# Patient Record
Sex: Male | Born: 1995 | Race: White | Hispanic: No | Marital: Single | Smoking: Never smoker
Health system: Southern US, Community
[De-identification: ages and names within clinical notes are randomized; demographics above are authoritative.]

## PROBLEM LIST (undated history)

## (undated) DIAGNOSIS — M62830 Muscle spasm of back: Secondary | ICD-10-CM

## (undated) DIAGNOSIS — J45909 Unspecified asthma, uncomplicated: Secondary | ICD-10-CM

## (undated) DIAGNOSIS — M25511 Pain in right shoulder: Secondary | ICD-10-CM

## (undated) DIAGNOSIS — I10 Essential (primary) hypertension: Secondary | ICD-10-CM

## (undated) DIAGNOSIS — K429 Umbilical hernia without obstruction or gangrene: Secondary | ICD-10-CM

## (undated) DIAGNOSIS — L700 Acne vulgaris: Secondary | ICD-10-CM

## (undated) DIAGNOSIS — G8929 Other chronic pain: Secondary | ICD-10-CM

## (undated) HISTORY — PX: FOOT SURGERY: SHX648

## (undated) HISTORY — DX: Pain in right shoulder: M25.511

## (undated) HISTORY — DX: Umbilical hernia without obstruction or gangrene: K42.9

## (undated) HISTORY — DX: Muscle spasm of back: M62.830

## (undated) HISTORY — DX: Acne vulgaris: L70.0

## (undated) HISTORY — DX: Other chronic pain: G89.29

## (undated) HISTORY — DX: Unspecified asthma, uncomplicated: J45.909

## (undated) HISTORY — DX: Essential (primary) hypertension: I10

---
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
## 2013-03-20 ENCOUNTER — Ambulatory Visit (INDEPENDENT_AMBULATORY_CARE_PROVIDER_SITE_OTHER): Payer: BC Managed Care – PPO | Admitting: Internal Medicine

## 2013-03-20 ENCOUNTER — Encounter: Payer: Self-pay | Admitting: Internal Medicine

## 2013-03-20 VITALS — BP 124/80 | HR 84 | Temp 98.0°F | Wt 163.2 lb

## 2013-03-20 DIAGNOSIS — J069 Acute upper respiratory infection, unspecified: Secondary | ICD-10-CM

## 2013-03-20 MED ORDER — AZITHROMYCIN 250 MG PO TABS: ORAL_TABLET | ORAL | Status: DC

## 2013-03-20 NOTE — Progress Notes (Signed)
HPI  Pt presents to the clinic today with c/o cough, sore throat and ear fullness. This started about 2 weeks ago. The cough is non productive. He has tried Mucinex and Nyquil OTC.  He denies fever, chills or body aches. He has had sick contacts.  Review of Systems     History reviewed. No pertinent past medical history.  History reviewed. No pertinent family history.  History   Social History  . Marital Status: Single    Spouse Name: N/A    Number of Children: N/A  . Years of Education: N/A   Occupational History  . Not on file.   Social History Main Topics  . Smoking status: Never Smoker   . Smokeless tobacco: Never Used  . Alcohol Use: No  . Drug Use: No  . Sexual Activity: Not on file   Other Topics Concern  . Not on file   Social History Narrative  . No narrative on file    No Known Allergies   Constitutional: Positive headache, fatigue. Denies fever or abrupt weight changes.  HEENT:  Positive sore throat. Denies eye redness, eye pain, pressure behind the eyes, facial pain, nasal congestion, ear pain, ringing in the ears, wax buildup, runny nose or bloody nose. Respiratory: Positive cough. Denies difficulty breathing or shortness of breath.  Cardiovascular: Denies chest pain, chest tightness, palpitations or swelling in the hands or feet.   No other specific complaints in a complete review of systems (except as listed in HPI above).  Objective:   BP 124/80  Pulse 84  Temp(Src) 98 F (36.7 C) (Oral)  Wt 163 lb 4 oz (74.05 kg)  SpO2 98% Wt Readings from Last 3 Encounters:  03/20/13 163 lb 4 oz (74.05 kg) (65%*, Z = 0.37)  12/07/12 157 lb (71.215 kg) (57%*, Z = 0.18)  01/19/12 164 lb 12 oz (74.73 kg) (73%*, Z = 0.60)   * Growth percentiles are based on CDC 2-20 Years data.     General: Appears his stated age, well developed, well nourished in NAD. HEENT: Head: normal shape and size; Eyes: sclera white, no icterus, conjunctiva pink, PERRLA and EOMs  intact; Ears: Tm's gray and intact, normal light reflex; Nose: mucosa pink and moist, septum midline; Throat/Mouth: + PND. Teeth present, mucosa erythematous and moist, no exudate noted, no lesions or ulcerations noted.  Neck: Mild cervical lymphadenopathy. Neck supple, trachea midline. No massses, lumps or thyromegaly present.  Cardiovascular: Normal rate and rhythm. S1,S2 noted.  No murmur, rubs or gallops noted. No JVD or BLE edema. No carotid bruits noted. Pulmonary/Chest: Normal effort and positive vesicular breath sounds. No respiratory distress. No wheezes, rales or ronchi noted.      Assessment & Plan:   Upper Respiratory Infection  Get some rest and drink plenty of water Do salt water gargles for the sore throat Given duration of symptoms, will give eRx for Azithromax x 5 days   RTC as needed or if symptoms persist.  '

## 2013-03-20 NOTE — Progress Notes (Signed)
Pre-visit discussion using our clinic review tool. No additional management support is needed unless otherwise documented below in the visit note.  

## 2013-03-20 NOTE — Patient Instructions (Signed)

## 2013-05-24 ENCOUNTER — Encounter: Payer: Self-pay | Admitting: Family Medicine

## 2013-05-24 ENCOUNTER — Ambulatory Visit: Payer: BC Managed Care – PPO | Admitting: Internal Medicine

## 2013-05-24 ENCOUNTER — Ambulatory Visit (INDEPENDENT_AMBULATORY_CARE_PROVIDER_SITE_OTHER): Payer: BC Managed Care – PPO | Admitting: Family Medicine

## 2013-05-24 VITALS — BP 110/72 | HR 74 | Temp 98.3°F | Ht 69.0 in | Wt 164.2 lb

## 2013-05-24 DIAGNOSIS — G5620 Lesion of ulnar nerve, unspecified upper limb: Secondary | ICD-10-CM

## 2013-05-24 DIAGNOSIS — G5621 Lesion of ulnar nerve, right upper limb: Secondary | ICD-10-CM

## 2013-05-24 DIAGNOSIS — S29012A Strain of muscle and tendon of back wall of thorax, initial encounter: Secondary | ICD-10-CM | POA: Insufficient documentation

## 2013-05-24 DIAGNOSIS — S239XXA Sprain of unspecified parts of thorax, initial encounter: Secondary | ICD-10-CM

## 2013-05-24 MED ORDER — CYCLOBENZAPRINE HCL 10 MG PO TABS: 10.0000 mg | ORAL_TABLET | Freq: Every evening | ORAL | Status: DC | PRN

## 2013-05-24 MED ORDER — DICLOFENAC SODIUM 75 MG PO TBEC: 75.0000 mg | DELAYED_RELEASE_TABLET | Freq: Two times a day (BID) | ORAL | Status: DC

## 2013-05-24 NOTE — Progress Notes (Signed)
Pre visit review using our clinic review tool, if applicable. No additional management support is needed unless otherwise documented below in the visit note. 

## 2013-05-24 NOTE — Assessment & Plan Note (Signed)
Nsaids, heat, exercises and muscle relaxant. Pout of work until follow up.

## 2013-05-24 NOTE — Progress Notes (Signed)
   Subjective:    Patient ID: Charles Fischer, male    DOB: 07/29/1995, 18 y.o.   MRN: [id number]  Back Pain Pertinent negatives include no chest pain or fever.    18 year old male with previous low back pain  and  pt of Dr. Elmer SowAron's presents with new onset of sudden back pain this AM, occurred after bending over.  He has been lifting a lot of heavy stuff at work lately.  Pain in middle of back but over to the left.  Moving arms, legs and head causes pain in mid back. No radiation of pain.  He has noted numbness in right, pinky finger. No numbness elsewhere. No weakness. No incontinence, no fever.  Felt lightheaded, cold and sweating after the pain started, now resolved.    Review of Systems  Constitutional: Positive for chills. Negative for fever and fatigue.  HENT: Negative for ear pain.   Eyes: Negative for pain.  Respiratory: Negative for cough and shortness of breath.   Cardiovascular: Negative for chest pain.  Musculoskeletal: Positive for back pain.       Objective:   Physical Exam  Constitutional: Vital signs are normal. He appears well-developed and well-nourished.  HENT:  Head: Normocephalic.  Right Ear: Hearing normal.  Left Ear: Hearing normal.  Nose: Nose normal.  Mouth/Throat: Oropharynx is clear and moist and mucous membranes are normal.  Neck: Trachea normal. Carotid bruit is not present. No mass and no thyromegaly present.  Cardiovascular: Normal rate, regular rhythm and normal pulses.  Exam reveals no gallop, no distant heart sounds and no friction rub.   No murmur heard. No peripheral edema  Pulmonary/Chest: Effort normal and breath sounds normal. No respiratory distress.  Musculoskeletal:       Thoracic back: He exhibits tenderness. He exhibits no bony tenderness.       Lumbar back: He exhibits no bony tenderness.       Back:  Positive ulnar compression on right elbow  Neurological: He has normal strength. No cranial nerve deficit or sensory  deficit. Gait normal.  Skin: Skin is warm, dry and intact. No rash noted.  Psychiatric: He has a normal mood and affect. His speech is normal and behavior is normal. Thought content normal.          Assessment & Plan:

## 2013-05-24 NOTE — Assessment & Plan Note (Signed)
Avoid compression at sleep and work.

## 2013-05-24 NOTE — Patient Instructions (Signed)
Heat, gentle stretching.  Stat antiinflammatory and muscle relaxant.  Stay out of work until next appt.  Follow up next Monday with PCP or Dr. Leonard SchwartzB.

## 2013-05-28 ENCOUNTER — Ambulatory Visit (INDEPENDENT_AMBULATORY_CARE_PROVIDER_SITE_OTHER): Payer: BC Managed Care – PPO | Admitting: Family Medicine

## 2013-05-28 ENCOUNTER — Encounter: Payer: Self-pay | Admitting: Family Medicine

## 2013-05-28 VITALS — BP 120/72 | HR 94 | Temp 98.3°F | Ht 70.0 in | Wt 161.2 lb

## 2013-05-28 DIAGNOSIS — S239XXA Sprain of unspecified parts of thorax, initial encounter: Secondary | ICD-10-CM

## 2013-05-28 DIAGNOSIS — G5621 Lesion of ulnar nerve, right upper limb: Secondary | ICD-10-CM

## 2013-05-28 DIAGNOSIS — G5620 Lesion of ulnar nerve, unspecified upper limb: Secondary | ICD-10-CM

## 2013-05-28 DIAGNOSIS — S29012A Strain of muscle and tendon of back wall of thorax, initial encounter: Secondary | ICD-10-CM

## 2013-05-28 MED ORDER — CYCLOBENZAPRINE HCL 10 MG PO TABS
10.0000 mg | ORAL_TABLET | Freq: Every evening | ORAL | Status: DC | PRN
Start: 2013-05-28 — End: 2016-03-05

## 2013-05-28 NOTE — Progress Notes (Signed)
Pre visit review using our clinic review tool, if applicable. No additional management support is needed unless otherwise documented below in the visit note. 

## 2013-05-28 NOTE — Assessment & Plan Note (Signed)
Resolved

## 2013-05-28 NOTE — Progress Notes (Signed)
Subjective:    Patient ID: Billey ChangDerrick M Prine, male    DOB: 12/26/1995, 18 y.o.   MRN: [id number]  Back Pain Pertinent negatives include no chest pain or fever.    18 year old male here for follow up.  Saw Dr. Ermalene SearingBedsole on 3/26 for new onset of sudden back pain after bending over. Note reviewed.   Pain in middle of back but over to the left.  Moving arms, legs and head causes pain in mid back. No radiation of pain.  He had and still does not have any red flag symptoms like weakness, incontinence, fever.  Dr. B felt he had an ulnar neuropathy and left upper back strain.  Advised NSAIDs, heat, exercises, muscle relaxant and wrote him out of work until he could follow up today.  Feels much better but still can feel it at the end of the day.  Takes flexeril at night only.  Patient Active Problem List   Diagnosis Date Noted  . Muscle strain of left upper back 05/24/2013  . Ulnar neuropathy at elbow of right upper extremity 05/24/2013  . Hypertriglyceridemia 10/19/2011  . Cystic acne 01/14/2011   No past medical history on file. Past Surgical History  Procedure Laterality Date  . Foot surgery  2009-2010   History  Substance Use Topics  . Smoking status: Never Smoker   . Smokeless tobacco: Never Used  . Alcohol Use: No   No family history on file. No Known Allergies Current Outpatient Prescriptions on File Prior to Visit  Medication Sig Dispense Refill  . diclofenac (VOLTAREN) 75 MG EC tablet Take 1 tablet (75 mg total) by mouth 2 (two) times daily.  30 tablet  0  . MINOCYCLINE HCL PO Take 2 capsules by mouth daily. For acne       No current facility-administered medications on file prior to visit.   The PMH, PSH, Social History, Family History, Medications, and allergies have been reviewed [facility], and have been updated if relevant.     Review of Systems  Constitutional: Negative for fever and fatigue.  HENT: Negative for ear pain.   Eyes: Negative for pain.    Respiratory: Negative for cough and shortness of breath.   Cardiovascular: Negative for chest pain.  Musculoskeletal: Positive for back pain.       Objective:   Physical Exam  Constitutional: Vital signs are normal. He appears well-developed and well-nourished.  HENT:  Head: Normocephalic.  Right Ear: Hearing normal.  Left Ear: Hearing normal.  Nose: Nose normal.  Mouth/Throat: Oropharynx is clear and moist and mucous membranes are normal.  Neck: Trachea normal. Carotid bruit is not present. No mass and no thyromegaly present.  Cardiovascular: Normal rate, regular rhythm and normal pulses.  Exam reveals no gallop, no distant heart sounds and no friction rub.   No murmur heard. No peripheral edema  Pulmonary/Chest: Effort normal and breath sounds normal. No respiratory distress.  Musculoskeletal:       Thoracic back: He exhibits tenderness. He exhibits no bony tenderness.       Lumbar back: He exhibits no bony tenderness.       Back:  Neurological: He has normal strength. No cranial nerve deficit or sensory deficit. Gait normal.  Skin: Skin is warm, dry and intact. No rash noted.  Psychiatric: He has a normal mood and affect. His speech is normal and behavior is normal. Thought content normal.          Assessment & Plan:

## 2013-05-28 NOTE — Assessment & Plan Note (Signed)
Improving.  Continue supportive care. Flexeril refilled. See AVS. Call or return to clinic prn if these symptoms worsen or fail to improve as anticipated. The patient indicates understanding of these issues and agrees with the plan.

## 2013-05-28 NOTE — Patient Instructions (Signed)
Good to see you. Continue flexeril at bedtime as needed. Try the hot water bottle.  Bend at the knees!

## 2013-06-05 ENCOUNTER — Other Ambulatory Visit: Payer: Self-pay | Admitting: Family Medicine

## 2013-06-05 NOTE — Telephone Encounter (Signed)
Last office visit 05/28/2013.  Ok to refill? 

## 2013-12-19 DIAGNOSIS — J309 Allergic rhinitis, unspecified: Secondary | ICD-10-CM | POA: Insufficient documentation

## 2014-03-01 HISTORY — PX: LASIK: SHX215

## 2014-08-01 ENCOUNTER — Other Ambulatory Visit: Payer: Self-pay | Admitting: Family Medicine

## 2014-10-03 ENCOUNTER — Ambulatory Visit (INDEPENDENT_AMBULATORY_CARE_PROVIDER_SITE_OTHER): Payer: BLUE CROSS/BLUE SHIELD | Admitting: Family Medicine

## 2014-10-03 ENCOUNTER — Encounter: Payer: Self-pay | Admitting: Family Medicine

## 2014-10-03 VITALS — BP 118/64 | HR 82 | Temp 98.2°F | Resp 14 | Ht 73.0 in | Wt 165.6 lb

## 2014-10-03 DIAGNOSIS — L700 Acne vulgaris: Secondary | ICD-10-CM | POA: Insufficient documentation

## 2014-10-03 DIAGNOSIS — R197 Diarrhea, unspecified: Secondary | ICD-10-CM | POA: Insufficient documentation

## 2014-10-03 DIAGNOSIS — J45909 Unspecified asthma, uncomplicated: Secondary | ICD-10-CM | POA: Insufficient documentation

## 2014-10-03 DIAGNOSIS — M25519 Pain in unspecified shoulder: Secondary | ICD-10-CM | POA: Insufficient documentation

## 2014-10-03 DIAGNOSIS — M62830 Muscle spasm of back: Secondary | ICD-10-CM | POA: Insufficient documentation

## 2014-10-03 NOTE — Patient Instructions (Signed)
Chronic Diarrhea  Diarrhea is frequent loose and watery bowel movements. It can cause you to feel weak and dehydrated. Dehydration can cause you to become tired and thirsty and to have a dry mouth, decreased urination, and dark yellow urine. Diarrhea is a sign of another problem, most often an infection that will not last long. In most cases, diarrhea lasts 2-3 days. Diarrhea that lasts longer than 4 weeks is called long-lasting (chronic) diarrhea. It is important to treat your diarrhea as directed by your health care provider to lessen or prevent future episodes of diarrhea.   CAUSES   There are many causes of chronic diarrhea. The following are some possible causes:   · Gastrointestinal infections caused by viruses, bacteria, or parasites.    · Food poisoning or food allergies.    · Certain medicines, such as antibiotics, chemotherapy, and laxatives.    · Artificial sweeteners and fructose.    · Digestive disorders, such as celiac disease and inflammatory bowel diseases.    · Irritable bowel syndrome.  · Some disorders of the pancreas.  · Disorders of the thyroid.  · Reduced blood flow to the intestines.  · Cancer.  Sometimes the cause of chronic diarrhea is unknown.  RISK FACTORS  · Having a severely weakened immune system, such as from HIV or AIDS.    · Taking certain types of cancer-fighting drugs (such as with chemotherapy) or other medicines.    · Having had a recent organ transplant.    · Having a portion of the stomach or small bowel removed.    · Traveling to countries where food and water supplies are often contaminated.    SYMPTOMS   In addition to frequent, loose stools, diarrhea may cause:   · Cramping.    · Abdominal pain.    · Nausea.    · Fever.  · Fatigue.  · Urgent need to use the bathroom.  · Loss of bowel control.  DIAGNOSIS   Your health care provider must take a careful history and perform a physical exam. Tests given are based on your symptoms and history. Tests may include:   · Blood or  stool tests. Three or more stool samples may be examined. Stool cultures may be used to test for bacteria or parasites.    · X-rays.    · A procedure in which a thin tube is inserted into the mouth or rectum (endoscopy). This allows the health care provider to look inside the intestine.    TREATMENT   · Treatment is aimed at correcting the cause of the diarrhea when possible.  · Diarrhea caused by an infection can often be treated with antibiotic medicines.  · Diarrhea not caused by an infection may require you to take long-term medicine or have surgery. Specific treatment should be discussed with your health care provider.  · If the cause cannot be determined, treatment aims to relieve symptoms and prevent dehydration. Serious health problems can occur if you do not maintain proper fluid levels. Treatment may include:  ¨ Taking an oral rehydration solution (ORS).  ¨ Not drinking beverages that contain caffeine (such as tea, coffee, and soft drinks).  ¨ Not drinking alcohol.  ¨ Maintaining well-balanced nutrition to help you recover faster.  HOME CARE INSTRUCTIONS   · Drink enough fluids to keep urine clear or pale yellow. Drink 1 cup (8 oz) of fluid for each diarrhea episode. Avoid fluids that contain simple sugars, fruit juices, whole milk products, and sodas. Hydrate with an ORS. You may purchase the ORS or prepare it at home by mixing the   following ingredients together:  ¨  - tsp (1.7-3  mL) table salt.  ¨ ¾ tsp (3 ¾ mL) baking soda.  ¨  tsp (1.7 mL) salt substitute containing potassium chloride.  ¨ 1 tbsp (20 mL) sugar.  ¨ 4.2 c (1 L) of water.    · Certain foods and beverages may increase the speed at which food moves through the gastrointestinal (GI) tract. These foods and beverages should be avoided. They include:  ¨ Caffeinated and alcoholic beverages.  ¨ High-fiber foods, such as raw fruits and vegetables, nuts, seeds, and whole grain breads and cereals.  ¨ Foods and beverages sweetened with sugar  alcohols, such as xylitol, sorbitol, and mannitol.    · Some foods may be well tolerated and may help thicken stool. These include:  ¨ Starchy foods, such as rice, toast, pasta, low-sugar cereal, oatmeal, grits, baked potatoes, crackers, and bagels.  ¨ Bananas.  ¨ Applesauce.  · Add probiotic-rich foods to help increase healthy bacteria in the GI tract. These include yogurt and fermented milk products.  · Wash your hands well after each diarrhea episode.  · Only take over-the-counter or prescription medicines as directed by your health care provider.  · Take a warm bath to relieve any burning or pain from frequent diarrhea episodes.  SEEK MEDICAL CARE IF:   · You are not urinating as often.  · Your urine is a dark color.  · You become very tired or dizzy.  · You have severe pain in the abdomen or rectum.  · Your have blood or pus in your stools.  · Your stools look black and tarry.  SEEK IMMEDIATE MEDICAL CARE IF:   · You are unable to keep fluids down.  · You have persistent vomiting.  · You have blood in your stool.  · Your stools are black and tarry.  · You do not urinate in 6-8 hours, or there is only a small amount of very dark urine.  · You have abdominal pain that increases or localizes.  · You have weakness, dizziness, confusion, or lightheadedness.  · You have a severe headache.  · Your diarrhea gets worse or does not get better.  · You have a fever or persistent symptoms for more than 2-3 days.  · You have a fever and your symptoms suddenly get worse.  MAKE SURE YOU:   · Understand these instructions.  · Will watch your condition.  · Will get help right away if you are not doing well or get worse.  Document Released: 05/08/2003 Document Revised: 02/20/2013 Document Reviewed: 08/10/2012  Exit[employer]® Patient Information ©2015 [employer]. This information is not intended to replace advice given to you by your health care provider. Make sure you discuss any questions you have with your health care  provider.

## 2014-10-03 NOTE — Progress Notes (Signed)
Name: Charles Fischer   MRN: [id number]    DOB: [birth year]   Date:10/03/2014       Progress Note  Subjective  Chief Complaint  Chief Complaint  Patient presents with  . Diarrhea    onset since feb. has changed diet.  Everyday occurence, Pt deniesany abdominal pain    HPI  Diarrhea: Patient complains of diarrhea. Symptoms have been present for approximately 5 months. The symptoms are unchanged. Stool frequency is approximately 2 per day. Patient estimates stool volume to be patient is unable to estimate stool volume. Diarrhea does not occur at night. The patient has noted no bleeding associated with bowel movements. The also patient reports the following symptoms: none. The patient denies the following symptoms: abdominal cramping relieved by defecation, abdominal distension, bloating, cramping unrelieved by defecation, diarrhea alternating with constipation, fecal incontinence, fever, joint aches, localized abdominal pain (location: not applicable), melena, mucus with stools, night sweats, pus with stools, red blood with stools and weight loss. The patient currently denies significant abdominal pain or discomfort.   Relationship to food: the patient denies any relationship to food, but he has changed his diet to a high protein low carb diet starting about March 2016. Relationship to medications: the patient denies any relationship to medications.  He has not been using his acne medication minocycline since before all this started. Other risk factors: none. Therapy tried so far: none. Work up so far: none.    Patient Active Problem List   Diagnosis Date Noted  . Back muscle spasm 10/03/2014  . Pain in shoulder 10/03/2014  . Acne cystica 10/03/2014  . Mild persistent asthma with acute exacerbation 10/03/2014  . Allergic rhinitis 12/19/2013  . Muscle strain of left upper back 05/24/2013  . Ulnar neuropathy at elbow of right upper extremity 05/24/2013  . Hypertriglyceridemia 10/19/2011  .  Cystic acne 01/14/2011    History  Substance Use Topics  . Smoking status: Never Smoker   . Smokeless tobacco: Never Used  . Alcohol Use: No     Current outpatient prescriptions:  .  cyclobenzaprine (FLEXERIL) 10 MG tablet, Take 1 tablet (10 mg total) by mouth at bedtime as needed for muscle spasms., Disp: 15 tablet, Rfl: 0 .  MINOCYCLINE HCL PO, Take 2 capsules by mouth daily. For acne, Disp: , Rfl:   Past Surgical History  Procedure Laterality Date  . Foot surgery  2009-2010  . Achilles tendon lengthening Bilateral 2009    Family History  Problem Relation Age of Onset  . Alcohol abuse Father   . Hyperlipidemia Father   . Cancer Maternal Grandmother     breast    No Known Allergies   Review of Systems  CONSTITUTIONAL: No significant weight changes, fever, chills, weakness or fatigue.  HEENT:  - Eyes: No visual changes.  - Ears: No auditory changes. No pain.  - Nose: No sneezing, congestion, runny nose. - Throat: No sore throat. No changes in swallowing. SKIN: No rash or itching.  CARDIOVASCULAR: No chest pain, chest pressure or chest discomfort. No palpitations or edema.  RESPIRATORY: No shortness of breath, cough or sputum.  GASTROINTESTINAL: No anorexia, nausea, vomiting. Yes changes in bowel habits. No abdominal pain or blood.  GENITOURINARY: No dysuria. No frequency. No discharge. NEUROLOGICAL: No headache, dizziness, syncope, paralysis, ataxia, numbness or tingling in the extremities. No memory changes. No change in bowel or bladder control.  MUSCULOSKELETAL: No joint pain. No muscle pain. HEMATOLOGIC: No anemia, bleeding or bruising.  LYMPHATICS: No enlarged lymph  nodes.  PSYCHIATRIC: No change in mood. No change in sleep pattern.  ENDOCRINOLOGIC: No reports of sweating, cold or heat intolerance. No polyuria or polydipsia.     Objective  BP 118/64 mmHg  Pulse 82  Temp(Src) 98.2 F (36.8 C) (Oral)  Resp 14  Ht 6\' 1"  (1.854 m)  Wt 165 lb 9.6 oz  (75.116 kg)  BMI 21.85 kg/m2  SpO2 98% Body mass index is 21.85 kg/(m^2).  Physical Exam  Constitutional: Patient appears well-developed and well-nourished. In no distress.  HEENT:  - Head: Normocephalic and atraumatic.  - Ears: Bilateral TMs gray, no erythema or effusion - Nose: Nasal mucosa moist - Mouth/Throat: Oropharynx is clear and moist. No tonsillar hypertrophy or erythema. No post nasal drainage.  - Eyes: Conjunctivae clear, EOM movements normal. PERRLA. No scleral icterus.  Neck: Normal range of motion. Neck supple. No JVD present. No thyromegaly present.  Cardiovascular: Normal rate, regular rhythm and normal heart sounds.  No murmur heard.  Pulmonary/Chest: Effort normal and breath sounds normal. No respiratory distress. Abdomen: Soft, flat, non tender, normal bowel sounds, no HSM. Musculoskeletal: Normal range of motion bilateral UE and LE, no joint effusions. Peripheral vascular: Bilateral LE no edema. Neurological: CN II-XII grossly intact with no focal deficits. Alert and oriented to person, place, and time. Coordination, balance, strength, speech and gait are normal.  Skin: Skin is warm and dry. No rash noted. No erythema.  Psychiatric: Patient has a normal mood and affect. Behavior is normal in office today. Judgment and thought content normal in office today.   Assessment & Plan  1. Diarrhea I am concerned for C.Diff infection given his previous history of Minocyclin use for acne even though he has not been using the medication prior to diarrhea onset. [facility] stool studies ordered. Other etiologies considered IBS or Celiac however he has no abdominal pain. He does not consume dairy much so I do not feel this is related to lactose intolerance.

## 2014-10-04 ENCOUNTER — Telehealth: Payer: Self-pay | Admitting: Family Medicine

## 2014-10-04 NOTE — Telephone Encounter (Signed)
[name]) requesting a return call. Patient was sent home on yesterday with a stool sample kit. They need clarification on what to do with the sodium chloride. Also have a question concerning the paperwork that came with it. [phone]

## 2014-10-04 NOTE — Telephone Encounter (Signed)
Charles Fischer can you please clarify the instructions on collecting the stool sample for his [facility] kit? Thank you.

## 2014-10-14 ENCOUNTER — Other Ambulatory Visit: Payer: Self-pay | Admitting: Family Medicine

## 2014-10-14 ENCOUNTER — Telehealth: Payer: Self-pay | Admitting: Family Medicine

## 2014-10-14 DIAGNOSIS — B839 Helminthiasis, unspecified: Secondary | ICD-10-CM | POA: Insufficient documentation

## 2014-10-14 MED ORDER — MEBENDAZOLE 100 MG PO CHEW: 100.0000 mg | CHEWABLE_TABLET | Freq: Two times a day (BID) | ORAL | Status: DC

## 2014-10-14 NOTE — Telephone Encounter (Signed)
Mom called stating that her son's sx persists, but she now knows why. She stated that he has worms and they have seen them. His rectum itches very badly and wanted to know what they should do. She stated that they all share a toilet and wanted to know if they should be concerned. I told her that I will consult with Dr. Sherley Bounds then will let her know.  After consulting with Dr. Sherley Bounds, mom was informed that a medication will be sent in fro her son, but if his sx continues then he will need to proceed with the stool studies or we can refer him to a GI specialist. She was also told that if any of them developed any sx then they will have to be seen by their pcp so that it could be documented and then a medication could be given. She agreed.

## 2014-10-14 NOTE — Telephone Encounter (Signed)
Steward Drone (mom) is requesting a return call. Not able to tell me why because it is personal (H) [phone] (C) [phone]

## 2014-10-14 NOTE — Telephone Encounter (Signed)
I have sent in Mebendazole  one twice a day for 3 days. May need to be repeated if symptoms ongoing in 1 week.

## 2014-10-24 ENCOUNTER — Telehealth: Payer: Self-pay | Admitting: Family Medicine

## 2014-10-24 NOTE — Telephone Encounter (Signed)
Contacted this patient's mom to relay the message below. She was encouraged to proceed with the study and then we could see what else needs to be done afterwards. She agreed and said ok.

## 2014-10-24 NOTE — Telephone Encounter (Signed)
Sorry to hear he is still having issues. He should still have the [facility] stool study kit with him that he needs to collect his stool specimen as instructed and bring back his sample to [place] to send out. If he lost the instructions have him stop by for nursing visit to teach him what he needs to do.

## 2014-10-24 NOTE — Telephone Encounter (Signed)
SAID THAT THE PATIENT MEDICATION IS NOT WORKING AND WHERE DO THEY GO FROM HERE.

## 2014-11-11 ENCOUNTER — Telehealth: Payer: Self-pay

## 2014-11-11 NOTE — Telephone Encounter (Signed)
Called to get results of son's drg testing. She was told by Marylene Land at [facility] that she was sending over results to Dr. Sherley Bounds last Wednesday. I told her that we have yet to received anything but if she has not heard form [place] by this Wednesday to give [place] a call back. She said ok.

## 2014-11-11 NOTE — Telephone Encounter (Signed)
I have not had anything return to me via EMR or hard copy regarding his stool studies. Please contact [facility] representative and inquire as to the results.

## 2014-11-12 NOTE — Telephone Encounter (Signed)
Spoke to Charles Fischer with customer service at [employer] and she stated that his labs were complete on the 8th and was released to the portal. I gave her our fax number and she said she will print it and then fax it to our office.

## 2014-11-13 ENCOUNTER — Telehealth: Payer: Self-pay | Admitting: Family Medicine

## 2014-11-13 NOTE — Telephone Encounter (Signed)
NEEDS RESULTS BUT HAS NEVER GOT A CALL BACK

## 2014-11-14 NOTE — Telephone Encounter (Signed)
Please return Steward Drone (mom) call just to inform her as to what is going on. States she has been calling every since last week for her son test results and have not heard anything. He has not gotten any better. Thank you

## 2014-11-14 NOTE — Telephone Encounter (Signed)
Left a message for the mom stating that we were still waiting to get the faxed results from [facility] and that we have even tried to access the portal but cannot get in.

## 2014-11-15 ENCOUNTER — Other Ambulatory Visit: Payer: Self-pay | Admitting: Family Medicine

## 2014-11-15 ENCOUNTER — Telehealth: Payer: Self-pay | Admitting: Family Medicine

## 2014-11-15 DIAGNOSIS — R197 Diarrhea, unspecified: Secondary | ICD-10-CM

## 2014-11-15 NOTE — Telephone Encounter (Signed)
Contacted this patient's mom to review the message from Dr. Sherley Bounds regarding his DRG results and to see if she wanted to proceed with a GI referral. Mom agreed and stated that her son starting working out in January and was consuming protein shakes as well as creatine everyday which is when his sx started. Mom was encouraged to tell him to stop and to see if that helped reduce or eliminate his sx. Meanwhile the referral will be placed and he is to keep a journal of all his meals/ sx to share with the GI specialist. She agreed and said thanks.

## 2014-11-15 NOTE — Telephone Encounter (Signed)
Please let Shana know that his stool studies have returned negative for C.diff, salmonella, shigella, E.coli, parasites, fungi. He has been exposed to H.pylori in the past but that shouldn't cause diarrhea for months. That typically causes epigastric discomfort and reflux.  At this point if his symptoms are ongoing I want him to see a GI specialist. Can I place the referral?

## 2014-12-10 ENCOUNTER — Ambulatory Visit (INDEPENDENT_AMBULATORY_CARE_PROVIDER_SITE_OTHER): Payer: BLUE CROSS/BLUE SHIELD | Admitting: Family Medicine

## 2014-12-10 ENCOUNTER — Encounter: Payer: Self-pay | Admitting: Family Medicine

## 2014-12-10 VITALS — BP 124/80 | HR 87 | Temp 99.0°F | Resp 16 | Ht 73.0 in | Wt 177.4 lb

## 2014-12-10 DIAGNOSIS — R197 Diarrhea, unspecified: Secondary | ICD-10-CM | POA: Diagnosis not present

## 2014-12-10 DIAGNOSIS — R1033 Periumbilical pain: Secondary | ICD-10-CM | POA: Diagnosis not present

## 2014-12-10 NOTE — Progress Notes (Signed)
Name: Charles Fischer   MRN: [id number]    DOB: June 16, 1995   Date:12/10/2014       Progress Note  Subjective  Chief Complaint  Chief Complaint  Patient presents with  . Umbilical Hernia    pt has had pain in area for 1 week    HPI  Charles Fischer is a 19 year old male who reports abdominal pain and discomfort in the umbilical area for 1 week now. He had been exercising with weights a little more aggressively and was not sure if he tore something or started developing a hernia. He does not note a bulge with coughing or sitting or standing. No nausea, vomiting.  If he recall he has also been having some changes to his BMs for several months now, Onset Feb 2016. Which he reports today has settled down but not resolved.  He was referred to [facility] for diarrhea for several months 11/15/14 with negative stool studies other than previous exposure to H.Pylori and normal blood work. He thought he had worms and was treated with Mebendazole but his stool study was negative. Patient instructed to keep food journal.  Past Medical History  Diagnosis Date  . Mild persistent asthma with acute exacerbation   . Cystic acne vulgaris   . Chronic right shoulder pain   . Back muscle spasm     Patient Active Problem List   Diagnosis Date Noted  . Worm infection 10/14/2014  . Back muscle spasm 10/03/2014  . Pain in shoulder 10/03/2014  . Acne cystica 10/03/2014  . Mild persistent asthma with acute exacerbation 10/03/2014  . Diarrhea 10/03/2014  . Allergic rhinitis 12/19/2013  . Muscle strain of left upper back 05/24/2013  . Ulnar neuropathy at elbow of right upper extremity 05/24/2013  . Hypertriglyceridemia 10/19/2011  . Cystic acne 01/14/2011    Social History  Substance Use Topics  . Smoking status: Never Smoker   . Smokeless tobacco: Never Used  . Alcohol Use: No     Current outpatient prescriptions:  .  cyclobenzaprine (FLEXERIL) 10 MG tablet, Take 1 tablet (10 mg total) by  mouth at bedtime as needed for muscle spasms., Disp: 15 tablet, Rfl: 0  No Known Allergies  Review of Systems  Positive for umbilical discomfort with ongoing loose bowel movements as mentioned in HPI, otherwise all systems reviewed and are negative.  Objective  BP 124/80 mmHg  Pulse 87  Temp(Src) 99 F (37.2 C)  Resp 16  Ht  (1.854 m)  Wt 177 lb 6 oz (80.457 kg)  BMI 23.41 kg/m2  SpO2 98%  Body mass index is 23.41 kg/(m^2).   Physical Exam  Constitutional: Patient appears well-developed and well-nourished. In no distress.  Cardiovascular: Normal rate, regular rhythm and normal heart sounds.  No murmur heard.  Pulmonary/Chest: Effort normal and breath sounds normal. No respiratory distress. Abdomen: Soft, non tender, non distended, no hernias identified with valsalva sitting or laying, normal bowel sounds in all four quadrants. No HSM. Psychiatric: Patient has a normal mood and affect. Behavior is normal in office today. Judgment and thought content normal in office today.    Assessment & Plan  1. Umbilical pain Clinical exam did not show significant hernia. I recommended proceeding with a CT of abdomen however patient declined until he found out of his insurance would cover it.   2. Diarrhea, unspecified type GI referral in place. Will make sure he is aware of his appointment.

## 2014-12-26 ENCOUNTER — Encounter: Payer: Self-pay | Admitting: Family Medicine

## 2015-02-28 ENCOUNTER — Ambulatory Visit (INDEPENDENT_AMBULATORY_CARE_PROVIDER_SITE_OTHER): Payer: BLUE CROSS/BLUE SHIELD | Admitting: Family Medicine

## 2015-02-28 ENCOUNTER — Encounter: Payer: Self-pay | Admitting: Family Medicine

## 2015-02-28 VITALS — BP 120/69 | HR 105 | Temp 98.9°F | Resp 17 | Ht 73.0 in | Wt 180.3 lb

## 2015-02-28 DIAGNOSIS — J45909 Unspecified asthma, uncomplicated: Secondary | ICD-10-CM | POA: Insufficient documentation

## 2015-02-28 DIAGNOSIS — Z2821 Immunization not carried out because of patient refusal: Secondary | ICD-10-CM

## 2015-02-28 DIAGNOSIS — J4531 Mild persistent asthma with (acute) exacerbation: Secondary | ICD-10-CM | POA: Diagnosis not present

## 2015-02-28 DIAGNOSIS — Z0000 Encounter for general adult medical examination without abnormal findings: Secondary | ICD-10-CM | POA: Diagnosis not present

## 2015-02-28 MED ORDER — PREDNISONE 10 MG (21) PO TBPK: ORAL_TABLET | ORAL | Status: DC

## 2015-02-28 MED ORDER — AMOXICILLIN-POT CLAVULANATE 875-125 MG PO TABS
1.0000 | ORAL_TABLET | Freq: Two times a day (BID) | ORAL | Status: DC
Start: 2015-02-28 — End: 2016-03-05

## 2015-02-28 NOTE — Progress Notes (Signed)
Name: Charles Fischer   MRN: [id number]    DOB: 1995-12-13   Date:02/28/2015       Progress Note  Subjective  Chief Complaint  Chief Complaint  Patient presents with  . Annual Exam    CPE    HPI  Patient is here today for a Complete Male Physical Exam:  The patient has acute concerns consisting of cough, slight wheezing, productive cough, nasal congestion, ear pressure. Overall feels health needs are stable. Still has loose BMs on/off, abdominal hernia resolved, occasional back problems managed with muscle relaxer exercise and chiropractor. Diet is well balanced. In general does exercise regularly. Sees dentist regularly and addresses vision concerns with ophthalmologist if applicable. In regards to sexual activity the patient is currently sexually active. Currently is not concerned about exposure to any STDs.   Declines flu shot, tetanus booster, STD testing.   Past Medical History  Diagnosis Date  . Mild persistent asthma with acute exacerbation   . Cystic acne vulgaris   . Chronic right shoulder pain   . Back muscle spasm     Past Surgical History  Procedure Laterality Date  . Foot surgery  2009-2010  . Achilles tendon lengthening Bilateral 2009    Family History  Problem Relation Age of Onset  . Alcohol abuse Father   . Hyperlipidemia Father   . Cancer Maternal Grandmother     breast    Social History   Social History  . Marital Status: Single    Spouse Name: N/A  . Number of Children: N/A  . Years of Education: N/A   Occupational History  . Not on file.   Social History Main Topics  . Smoking status: Never Smoker   . Smokeless tobacco: Never Used  . Alcohol Use: No  . Drug Use: No  . Sexual Activity: Yes   Other Topics Concern  . Not on file   Social History Narrative     Current outpatient prescriptions:  .  cyclobenzaprine (FLEXERIL) 10 MG tablet, Take 1 tablet (10 mg total) by mouth at bedtime as needed for muscle spasms., Disp: 15  tablet, Rfl: 0 .  DUREZOL 0.05 % EMUL, USE 1 DROP IN BOTH EYES 3 TIMES A DAY X 2 DAYS PRIOR TO SURGERY & 7 DAYS AFTER SURGERY OR TIL EMPTY, Disp: , Rfl: 2 .  minocycline (MINOCIN,DYNACIN) 50 MG capsule, Take 2 capsules by mouth daily., Disp: , Rfl: 1 .  RESTASIS 0.05 % ophthalmic emulsion, USE 1 DROP TWICE A DAY IN BOTH EYES, Disp: , Rfl: 2 .  ZYMAXID 0.5 % SOLN, PLACE 1 DROP IN BOTH EYES 3 TIMES DAILY X 2 DAYS PRIOR TO SURGERY AND FOR 7 DAYS AFTER, Disp: , Rfl: 2  No Known Allergies  ROS  CONSTITUTIONAL: No significant weight changes, fever, chills, weakness or fatigue.  HEENT:  - Eyes: No visual changes.  - Ears: No auditory changes. No pain.  - Nose: Yes congestion, stuffy nose. - Throat: No sore throat. No changes in swallowing. SKIN: No rash or itching.  CARDIOVASCULAR: No chest pain, chest pressure or chest discomfort. No palpitations or edema.  RESPIRATORY: Yes shortness of breath, cough or sputum.  GASTROINTESTINAL: No anorexia, nausea, vomiting. No changes in bowel habits. No abdominal pain or blood.  GENITOURINARY: No dysuria. No frequency. No discharge.  NEUROLOGICAL: No headache, dizziness, syncope, paralysis, ataxia, numbness or tingling in the extremities. No memory changes. No change in bowel or bladder control.  MUSCULOSKELETAL: No joint pain. No muscle pain.  HEMATOLOGIC: No anemia, bleeding or bruising.  LYMPHATICS: No enlarged lymph nodes.  PSYCHIATRIC: No change in mood. No change in sleep pattern.  ENDOCRINOLOGIC: No reports of sweating, cold or heat intolerance. No polyuria or polydipsia.   Objective  Filed Vitals:   02/28/15 1430  BP: 120/69  Pulse: 105  Temp: 98.9 F (37.2 C)  TempSrc: Oral  Resp: 17  Height: 6\' 1"  (1.854 m)  Weight: 180 lb 4.8 oz (81.784 kg)  SpO2: 99%   Body mass index is 23.79 kg/(m^2).  [facility] 02/28/2015 10/03/2014  Decreased Interest 0 0  Down, Depressed, Hopeless 0 0  PHQ - 2 Score 0 0     Physical  Exam  Constitutional: Patient appears well-developed and well-nourished. In no distress.  HEENT:  - Head: Normocephalic and atraumatic.  - Ears: Bilateral TMs gray, no erythema or effusion - Nose: Nasal mucosa moist, boggy, congested.  - Mouth/Throat: Oropharynx is clear and moist. No tonsillar hypertrophy or erythema. No post nasal drainage.  - Eyes: Conjunctivae clear, EOM movements normal. PERRLA. No scleral icterus.  Neck: Normal range of motion. Neck supple. No JVD present. No thyromegaly present.  Cardiovascular: Normal rate, regular rhythm and normal heart sounds.  No murmur heard.  Pulmonary/Chest: Effort normal and breath sounds decreased with scattered rhonchi. Abdominal: Soft. Bowel sounds are normal, no distension. There is no tenderness. no masses BREAST: Bilateral breast exam normal with no masses, skin changes or nipple discharge MALE GENITALIA: Deferred.  Musculoskeletal: Normal range of motion bilateral UE and LE, no joint effusions. Peripheral vascular: Bilateral LE no edema. Neurological: CN II-XII grossly intact with no focal deficits. Alert and oriented to person, place, and time. Coordination, balance, strength, speech and gait are normal.  Skin: Skin is warm and dry. No rash noted. No erythema.  Psychiatric: Patient has a normal mood and affect. Behavior is normal in office today. Judgment and thought content normal in office today.    Assessment & Plan  1. Annual physical exam Discussed in detail all recommended preventative measures appropriate for age and gender now and in the future.  He declined STD testing, blood work.  2. Influenza vaccination declined by patient   3. Asthma with bronchitis, mild persistent, with acute exacerbation Start treatment.  - amoxicillin-clavulanate (AUGMENTIN) 875-125 MG tablet; Take 1 tablet by mouth 2 (two) times daily.  Dispense: 20 tablet; Refill: 0 - predniSONE (STERAPRED UNI-PAK 21 TAB) 10 MG (21) TBPK tablet; Use as  directed in a 6 day taper PredPak  Dispense: 21 tablet; Refill: 0

## 2015-03-18 ENCOUNTER — Telehealth: Payer: Self-pay | Admitting: Family Medicine

## 2015-03-18 ENCOUNTER — Other Ambulatory Visit: Payer: Self-pay | Admitting: Family Medicine

## 2015-03-18 MED ORDER — LEVOFLOXACIN 500 MG PO TABS: 500.0000 mg | ORAL_TABLET | Freq: Every day | ORAL | Status: DC

## 2015-03-18 NOTE — Telephone Encounter (Signed)
Patient is not feeling better. Was on an antibotic but its not helping. Please send something stronger to [facility]

## 2015-03-18 NOTE — Telephone Encounter (Signed)
Levaquin sent to pharmacy ?

## 2016-03-05 ENCOUNTER — Encounter: Payer: Self-pay | Admitting: Primary Care

## 2016-03-05 ENCOUNTER — Ambulatory Visit (INDEPENDENT_AMBULATORY_CARE_PROVIDER_SITE_OTHER): Payer: BLUE CROSS/BLUE SHIELD | Admitting: Primary Care

## 2016-03-05 VITALS — BP 120/84 | HR 80 | Temp 98.3°F | Ht 70.5 in | Wt 186.1 lb

## 2016-03-05 DIAGNOSIS — J4520 Mild intermittent asthma, uncomplicated: Secondary | ICD-10-CM | POA: Diagnosis not present

## 2016-03-05 DIAGNOSIS — L700 Acne vulgaris: Secondary | ICD-10-CM

## 2016-03-05 MED ORDER — MINOCYCLINE HCL 50 MG PO CAPS: 50.0000 mg | ORAL_CAPSULE | Freq: Two times a day (BID) | ORAL | 1 refills | Status: DC

## 2016-03-05 NOTE — Progress Notes (Signed)
Pre visit review using our clinic review tool, if applicable. No additional management support is needed unless otherwise documented below in the visit note. 

## 2016-03-05 NOTE — Assessment & Plan Note (Signed)
Mild, intermittent. No symptoms for years.

## 2016-03-05 NOTE — Assessment & Plan Note (Signed)
Successful wean from minocycline for months, acne returned several months ago. Does well with minocycline. Discussed to try weaning off in several months to prevent resistance. Also discussed to take 1 capsule twice daily.

## 2016-03-05 NOTE — Patient Instructions (Signed)
Start taking your minocycline 50 mg twice daily. Take 1 capsule by mouth twice daily.   Try to come off of this medication in the Fall this year.  It was a pleasure to meet you today! Please don't hesitate to call me with any questions. Welcome to [facility]!

## 2016-03-05 NOTE — Progress Notes (Signed)
   Subjective:    Patient ID: Charles Fischer, male    DOB: 04/21/1995, 21 y.o.   MRN: [id number]  HPI  [name] is a 21 year old male who presents today to establish care and discuss the problems mentioned below. Will obtain old records.  1) Ance Vulgaris: Chronic for years. Located to his face, neck, and chest. Currently managed on minocycline 50 mg capsules for which he's taken for the past 5 years. He's stopped taking his minocycline 1 year ago and didn't have breakouts for several months. Three months ago he started experiencing breakouts and restartd his medication. He's currently taking 100 mg every morning.   2) Asthma: Diagnosed in childhood. He denies symptoms of wheezing, shortness of breath, cough. He's not used an inhaler in over 10 years. He's not had symptoms or problems for 10 years.  Review of Systems  Respiratory: Negative for shortness of breath and wheezing.   Cardiovascular: Negative for chest pain.  Skin:       Chest, neck, and facial acne.       Past Medical History:  Diagnosis Date  . Back muscle spasm   . Chronic right shoulder pain   . Cystic acne vulgaris   . Mild persistent asthma with acute exacerbation      Social History   Social History  . Marital status: Single    Spouse name: N/A  . Number of children: N/A  . Years of education: N/A   Occupational History  . Not on file.   Social History Main Topics  . Smoking status: Never Smoker  . Smokeless tobacco: Never Used  . Alcohol use No  . Drug use: No  . Sexual activity: Yes   Other Topics Concern  . Not on file   Social History Narrative   Single.   No children.   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    Enjoys working out, playing games, spending time with friends.    Past Surgical History:  Procedure Laterality Date  . ACHILLES TENDON LENGTHENING Bilateral 2009  . FOOT SURGERY  2009-2010  . LASIK  2016    Family History  Problem Relation Age of Onset  . Alcohol abuse Father   .  Hyperlipidemia Father   . Cancer Maternal Grandmother     breast    No Known Allergies  No current outpatient prescriptions on file prior to visit.   No current facility-administered medications on file prior to visit.     BP 120/84   Pulse 80   Temp 98.3 F (36.8 C) (Oral)   Ht 5' 10.5" (1.791 m)   Wt 186 lb 1.9 oz (84.4 kg)   SpO2 99%   BMI 26.33 kg/m    Objective:   Physical Exam  Constitutional: He appears well-nourished.  Neck: Neck supple.  Cardiovascular: Normal rate and regular rhythm.   Pulmonary/Chest: Effort normal and breath sounds normal. He has no wheezes.  Skin: Skin is warm and dry.  Mild cystic acne to anterior chest wall and neck. No pustules noted to face.          Assessment & Plan:

## 2016-04-02 ENCOUNTER — Ambulatory Visit (INDEPENDENT_AMBULATORY_CARE_PROVIDER_SITE_OTHER): Payer: BLUE CROSS/BLUE SHIELD | Admitting: Primary Care

## 2016-04-02 ENCOUNTER — Encounter: Payer: Self-pay | Admitting: Primary Care

## 2016-04-02 VITALS — BP 146/90 | HR 84 | Temp 98.7°F | Ht 71.0 in | Wt 186.8 lb

## 2016-04-02 DIAGNOSIS — E349 Endocrine disorder, unspecified: Secondary | ICD-10-CM | POA: Insufficient documentation

## 2016-04-02 DIAGNOSIS — R5383 Other fatigue: Secondary | ICD-10-CM | POA: Diagnosis not present

## 2016-04-02 NOTE — Patient Instructions (Signed)
Return between the hours between 8 am to 10 am to have your labs drawn. I will notify you of your results once received.  Consider reducing your hours at work as this could likely be contributing to your fatigue.  It was a pleasure to see you today!

## 2016-04-02 NOTE — Assessment & Plan Note (Signed)
Ongoing for the past 2-3 months. No obvious indication for cause other than working 50-60 hours weekly. Will check TSH, CMP, testosterone, B12, CBC to rule out metabolic cause. Discussed to continue a healthy diet, continue exercise, continue good sleep patters. Will await results.

## 2016-04-02 NOTE — Progress Notes (Signed)
Pre visit review using our clinic review tool, if applicable. No additional management support is needed unless otherwise documented below in the visit note. 

## 2016-04-02 NOTE — Progress Notes (Signed)
   Subjective:    Patient ID: Charles ChangDerrick M Fischer, male    DOB: 07/07/1995, 21 y.o.   MRN: [id number]  HPI  [name] is a 21 year old male who presents today with a chief complaint of fatigue. He has little to no energy that has been present for the past 2-3 months. He endorses a healthy diet. He exercises 4 times weekly. He sleeps 6-8 hours nightly and does not wake during the night. He works 50-60 hours weekly from the hours of 5:30 am to 5 pm Monday through Thursday, with overtime on Friday. He denies nausea, unexplained weight loss, cough, palpitations, hair loss, increased stress.   Review of Systems  Constitutional: Negative for unexpected weight change.  Eyes: Negative for visual disturbance.  Respiratory: Negative for shortness of breath.   Cardiovascular: Negative for chest pain.  Psychiatric/Behavioral: Negative for sleep disturbance. The patient is not nervous/anxious.        Past Medical History:  Diagnosis Date  . Asthma   . Back muscle spasm   . Chronic right shoulder pain   . Cystic acne vulgaris   . Umbilical hernia      Social History   Social History  . Marital status: Single    Spouse name: N/A  . Number of children: N/A  . Years of education: N/A   Occupational History  . Not on file.   Social History Main Topics  . Smoking status: Never Smoker  . Smokeless tobacco: Never Used  . Alcohol use No  . Drug use: No  . Sexual activity: Yes   Other Topics Concern  . Not on file   Social History Narrative   Single.   No children.   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    Enjoys working out, playing games, spending time with friends.    Past Surgical History:  Procedure Laterality Date  . ACHILLES TENDON LENGTHENING Bilateral 2009  . FOOT SURGERY  2009-2010  . LASIK  2016    Family History  Problem Relation Age of Onset  . Alcohol abuse Father   . Hyperlipidemia Father   . Cancer Maternal Grandmother     breast    No Known Allergies  Current  Outpatient Prescriptions on File Prior to Visit  Medication Sig Dispense Refill  . minocycline (MINOCIN,DYNACIN) 50 MG capsule Take 1 capsule (50 mg total) by mouth 2 (two) times daily. 180 capsule 1   No current facility-administered medications on file prior to visit.     BP (!) 146/90   Pulse 84   Temp 98.7 F (37.1 C) (Oral)   Ht 5\' 11"  (1.803 m)   Wt 186 lb 12.8 oz (84.7 kg)   SpO2 99%   BMI 26.05 kg/m    Objective:   Physical Exam  Constitutional: He appears well-nourished.  Neck: Neck supple. No thyromegaly present.  Cardiovascular: Normal rate and regular rhythm.   Pulmonary/Chest: Effort normal and breath sounds normal.  Skin: Skin is warm and dry.  Psychiatric: He has a normal mood and affect.          Assessment & Plan:

## 2016-04-05 ENCOUNTER — Other Ambulatory Visit (INDEPENDENT_AMBULATORY_CARE_PROVIDER_SITE_OTHER): Payer: BLUE CROSS/BLUE SHIELD

## 2016-04-05 DIAGNOSIS — R5383 Other fatigue: Secondary | ICD-10-CM

## 2016-04-05 LAB — CBC
HCT: 44.4 % (ref 39.0–52.0)
Hemoglobin: 15.5 g/dL (ref 13.0–17.0)
MCHC: 35 g/dL (ref 30.0–36.0)
MCV: 90.1 fl (ref 78.0–100.0)
Platelets: 246 10*3/uL (ref 150.0–400.0)
RBC: 4.93 Mil/uL (ref 4.22–5.81)
RDW: 12.5 % (ref 11.5–15.5)
WBC: 6.4 10*3/uL (ref 4.0–10.5)

## 2016-04-05 LAB — COMPREHENSIVE METABOLIC PANEL
ALBUMIN: 4.6 g/dL (ref 3.5–5.2)
ALT: 25 U/L (ref 0–53)
AST: 18 U/L (ref 0–37)
Alkaline Phosphatase: 75 U/L (ref 39–117)
BILIRUBIN TOTAL: 0.6 mg/dL (ref 0.2–1.2)
BUN: 19 mg/dL (ref 6–23)
CALCIUM: 9.8 mg/dL (ref 8.4–10.5)
CHLORIDE: 105 meq/L (ref 96–112)
CO2: 28 meq/L (ref 19–32)
CREATININE: 1.12 mg/dL (ref 0.40–1.50)
GFR: 86.86 mL/min (ref >60.00)
Glucose, Bld: 96 mg/dL (ref 70–99)
Potassium: 4.5 mEq/L (ref 3.5–5.1)
Sodium: 141 mEq/L (ref 135–145)
Total Protein: 7.1 g/dL (ref 6.0–8.3)

## 2016-04-05 LAB — VITAMIN D 25 HYDROXY (VIT D DEFICIENCY, FRACTURES): VITD: 21.25 ng/mL — AB (ref 30.00–100.00)

## 2016-04-05 LAB — TESTOSTERONE: Testosterone: 228.64 ng/dL — ABNORMAL LOW (ref 300.00–890.00)

## 2016-04-05 LAB — VITAMIN B12: VITAMIN B 12: 760 pg/mL (ref 211–911)

## 2016-04-05 LAB — TSH: TSH: 1.31 u[IU]/mL (ref 0.35–4.50)

## 2016-04-06 ENCOUNTER — Other Ambulatory Visit: Payer: Self-pay | Admitting: Primary Care

## 2016-04-06 ENCOUNTER — Encounter: Payer: Self-pay | Admitting: Primary Care

## 2016-04-06 DIAGNOSIS — R7989 Other specified abnormal findings of blood chemistry: Secondary | ICD-10-CM

## 2016-04-08 ENCOUNTER — Telehealth: Payer: Self-pay | Admitting: Primary Care

## 2016-04-08 NOTE — Telephone Encounter (Signed)
Patient returned Terri's call

## 2016-04-09 NOTE — Telephone Encounter (Signed)
Lab appt on 04/13/16.

## 2016-04-13 ENCOUNTER — Other Ambulatory Visit (INDEPENDENT_AMBULATORY_CARE_PROVIDER_SITE_OTHER): Payer: BLUE CROSS/BLUE SHIELD

## 2016-04-13 DIAGNOSIS — E291 Testicular hypofunction: Secondary | ICD-10-CM

## 2016-04-13 DIAGNOSIS — R7989 Other specified abnormal findings of blood chemistry: Secondary | ICD-10-CM

## 2016-04-13 LAB — LUTEINIZING HORMONE: LH: 3.52 m[IU]/mL (ref 1.50–9.30)

## 2016-04-13 LAB — FOLLICLE STIMULATING HORMONE: FSH: 4.1 m[IU]/mL (ref 1.4–18.1)

## 2016-04-16 LAB — TESTOS,TOTAL,FREE AND SHBG (FEMALE)
SEX HORMONE BINDING GLOB.: 15 nmol/L (ref 10–50)
TESTOSTERONE,TOTAL,LC/MS/MS: 347 ng/dL (ref 250–1100)
Testosterone, Free: 75.3 pg/mL (ref 35.0–155.0)

## 2016-04-20 ENCOUNTER — Ambulatory Visit (INDEPENDENT_AMBULATORY_CARE_PROVIDER_SITE_OTHER): Payer: BLUE CROSS/BLUE SHIELD | Admitting: Primary Care

## 2016-04-20 ENCOUNTER — Encounter: Payer: Self-pay | Admitting: Primary Care

## 2016-04-20 VITALS — BP 140/76 | HR 78 | Temp 98.5°F | Ht 71.0 in | Wt 186.8 lb

## 2016-04-20 DIAGNOSIS — J302 Other seasonal allergic rhinitis: Secondary | ICD-10-CM | POA: Diagnosis not present

## 2016-04-20 DIAGNOSIS — J4520 Mild intermittent asthma, uncomplicated: Secondary | ICD-10-CM | POA: Diagnosis not present

## 2016-04-20 MED ORDER — ALBUTEROL SULFATE HFA 108 (90 BASE) MCG/ACT IN AERS: 2.0000 | INHALATION_SPRAY | Freq: Four times a day (QID) | RESPIRATORY_TRACT | 3 refills | Status: DC | PRN

## 2016-04-20 NOTE — Progress Notes (Signed)
Pre visit review using our clinic review tool, if applicable. No additional management support is needed unless otherwise documented below in the visit note. 

## 2016-04-20 NOTE — Progress Notes (Signed)
   Subjective:    Patient ID: Charles Fischer, male    DOB: 06/22/1995, 21 y.o.   MRN: [id number]  HPI  Charles Fischer is a 21 year old male with a history of asthma and allergic rhinitis who presents today with a chief complaint of cough. He also reports wheezing, shortness of breath with moderate exertion, rhinorrhea. His cough has been present for the past 1 week. He denies fevers, sore throat, itchy/watery eyes, fatigue, sinus pressure. He's not taken anything OTC. Overall he's feeling well. He is managed on minocycline daily for cystic acne.  Review of Systems  Constitutional: Negative for chills, fatigue and fever.  HENT: Positive for congestion and rhinorrhea. Negative for sore throat.   Respiratory: Positive for cough, shortness of breath and wheezing.   Allergic/Immunologic: Positive for environmental allergies.       Past Medical History:  Diagnosis Date  . Asthma   . Back muscle spasm   . Chronic right shoulder pain   . Cystic acne vulgaris   . Umbilical hernia      Social History   Social History  . Marital status: Single    Spouse name: N/A  . Number of children: N/A  . Years of education: N/A   Occupational History  . Not on file.   Social History Main Topics  . Smoking status: Never Smoker  . Smokeless tobacco: Never Used  . Alcohol use No  . Drug use: No  . Sexual activity: Yes   Other Topics Concern  . Not on file   Social History Narrative   Single.   No children.   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    Enjoys working out, playing games, spending time with friends.    Past Surgical History:  Procedure Laterality Date  . ACHILLES TENDON LENGTHENING Bilateral 2009  . FOOT SURGERY  2009-2010  . LASIK  2016    Family History  Problem Relation Age of Onset  . Alcohol abuse Father   . Hyperlipidemia Father   . Cancer Maternal Grandmother     breast    No Known Allergies  Current Outpatient Prescriptions on File Prior to Visit  Medication Sig  Dispense Refill  . minocycline (MINOCIN,DYNACIN) 50 MG capsule Take 1 capsule (50 mg total) by mouth 2 (two) times daily. 180 capsule 1   No current facility-administered medications on file prior to visit.     BP 140/76   Pulse 78   Temp 98.5 F (36.9 C) (Oral)   Ht 5\' 11"  (1.803 m)   Wt 186 lb 12.8 oz (84.7 kg)   SpO2 99%   BMI 26.05 kg/m    Objective:   Physical Exam  Constitutional: He appears well-nourished. He does not appear ill.  HENT:  Right Ear: Tympanic membrane and ear canal normal.  Left Ear: Tympanic membrane and ear canal normal.  Nose: No mucosal edema. Right sinus exhibits no maxillary sinus tenderness and no frontal sinus tenderness. Left sinus exhibits no maxillary sinus tenderness and no frontal sinus tenderness.  Mouth/Throat: Oropharynx is clear and moist.  Eyes: Conjunctivae are normal.  Neck: Neck supple.  Cardiovascular: Normal rate and regular rhythm.   Pulmonary/Chest: Effort normal and breath sounds normal. He has no wheezes. He has no rales.  Skin: Skin is warm and dry.          Assessment & Plan:

## 2016-04-20 NOTE — Assessment & Plan Note (Signed)
Suspect symptoms related. Rx for albuterol inhaler sent to pharmacy. Discussed indications for use.

## 2016-04-20 NOTE — Assessment & Plan Note (Signed)
Symptoms likely related, especially given weather/seasonal changes. Start Zyrtec HS. Update if no improvement.

## 2016-04-20 NOTE — Patient Instructions (Signed)
Your symptoms are related to allergies and asthma.  Wheezing/Shortness of Breath: Try the Albuterol Inhaler: Inhale 2 puffs into the lungs every 6 to 8 hours as needed for wheezing and/or shortness of breath.   You may also try Zyrtec which will help with runny nose, drainage, cough.  You can try Delsym or Robitussin for cough.   Please notify me if you develop persistent fevers of 101, start coughing up green mucous, notice increased fatigue or weakness.  Increase consumption of water intake and rest.  Monitor your blood pressure and report readings at or above 140/90 on a consistent basis.   It was a pleasure to see you today!

## 2016-05-25 ENCOUNTER — Ambulatory Visit (INDEPENDENT_AMBULATORY_CARE_PROVIDER_SITE_OTHER): Payer: BLUE CROSS/BLUE SHIELD | Admitting: Primary Care

## 2016-05-25 ENCOUNTER — Encounter: Payer: Self-pay | Admitting: Primary Care

## 2016-05-25 VITALS — BP 142/94 | HR 92 | Temp 98.4°F | Ht 70.5 in | Wt 190.8 lb

## 2016-05-25 DIAGNOSIS — E349 Endocrine disorder, unspecified: Secondary | ICD-10-CM

## 2016-05-25 DIAGNOSIS — I10 Essential (primary) hypertension: Secondary | ICD-10-CM | POA: Diagnosis not present

## 2016-05-25 MED ORDER — LISINOPRIL 10 MG PO TABS
10.0000 mg | ORAL_TABLET | Freq: Every day | ORAL | 0 refills | Status: DC
Start: 2016-05-25 — End: 2016-06-15

## 2016-05-25 NOTE — Progress Notes (Signed)
   Subjective:    Patient ID: Charles Fischer, male    DOB: 06/16/1995, 21 y.o.   MRN: [id number]  HPI  Charles Fischer is a 21 year old male with a history of elevated who presents today with a chief complaint of elevated blood pressure readings.  1) Elevated Blood Pressure Reading: History of elevated readings from the last three visits that range from 140's/70's-90's. He's been checking his blood pressure twice daily at home which are running 140/90 consistently. He does endorse a stressful occupation but has recently cut back on hours and hasn't noticed improvement in his BP. He endorses a healthy diet and has no family history of hypertension. He denies headaches, chest pain, dizziness, shortness of breath. He did undergo testosterone deficiency treatment with Pellets four weeks ago.   2) Testosterone Deficiency: Diagnosed several months ago and is following with Urology. Recently underwent procedure with testosterone pellets. He has noticed symptoms of breast and nipple puffiness and tenderness. He has not returned to Urology for follow up as of yet. He would like his testosterone levels rechecked and also an estrogen test given symptoms.   Review of Systems  Eyes: Negative for visual disturbance.  Respiratory: Negative for shortness of breath.   Cardiovascular: Negative for chest pain and leg swelling.  Neurological: Negative for dizziness and headaches.       Past Medical History:  Diagnosis Date  . Asthma   . Back muscle spasm   . Chronic right shoulder pain   . Cystic acne vulgaris   . Umbilical hernia      Social History   Social History  . Marital status: Single    Spouse name: N/A  . Number of children: N/A  . Years of education: N/A   Occupational History  . Not on file.   Social History Main Topics  . Smoking status: Never Smoker  . Smokeless tobacco: Never Used  . Alcohol use No  . Drug use: No  . Sexual activity: Yes   Other Topics Concern  . Not on  file   Social History Narrative   Single.   No children.   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    Enjoys working out, playing games, spending time with friends.    Past Surgical History:  Procedure Laterality Date  . ACHILLES TENDON LENGTHENING Bilateral 2009  . FOOT SURGERY  2009-2010  . LASIK  2016    Family History  Problem Relation Age of Onset  . Alcohol abuse Father   . Hyperlipidemia Father   . Cancer Maternal Grandmother     breast    No Known Allergies  Current Outpatient Prescriptions on File Prior to Visit  Medication Sig Dispense Refill  . minocycline (MINOCIN,DYNACIN) 50 MG capsule Take 1 capsule (50 mg total) by mouth 2 (two) times daily. 180 capsule 1   No current facility-administered medications on file prior to visit.     BP (!) 142/94   Pulse 92   Temp 98.4 F (36.9 C) (Oral)   Ht 5' 10.5" (1.791 m)   Wt 190 lb 12.8 oz (86.5 kg)   SpO2 98%   BMI 26.99 kg/m    Objective:   Physical Exam  Constitutional: He appears well-nourished.  Neck: Neck supple.  Cardiovascular: Normal rate and regular rhythm.   Pulmonary/Chest: Effort normal and breath sounds normal.  Skin: Skin is warm and dry.          Assessment & Plan:

## 2016-05-25 NOTE — Assessment & Plan Note (Signed)
Low testosterone levels, currently following with Urology. Underwent pellet procedure. Check testosterone and estrogen in morning. He will deliver these labs to his Urologist.

## 2016-05-25 NOTE — Progress Notes (Signed)
Pre visit review using our clinic review tool, if applicable. No additional management support is needed unless otherwise documented below in the visit note. 

## 2016-05-25 NOTE — Patient Instructions (Signed)
Start lisinopril 10 mg for high blood pressure. Take 1 tablet by mouth once daily.   Schedule a lab only appointment between the hours of 8am-10am to recheck your testosterone and to check estrogen.  Schedule a follow up appointment in 3 weeks for re-evaluation of your blood pressure. Continue to monitor your blood pressure once daily as discussed.  It was a pleasure to see you today!

## 2016-05-25 NOTE — Assessment & Plan Note (Signed)
Noted on three separate office visits, also with elevation in home readings. BP elevated prior to testosterone treatment. Given numerous elevated readings will treat. Rx for Lisinopril 10 mg sent to pharmacy. Will have him monitor home readings x 2-3 weeks, return at that time for follow up. BMP next visit.

## 2016-05-26 ENCOUNTER — Other Ambulatory Visit: Payer: BLUE CROSS/BLUE SHIELD

## 2016-05-26 DIAGNOSIS — E349 Endocrine disorder, unspecified: Secondary | ICD-10-CM

## 2016-05-30 LAB — ESTROGENS, TOTAL: ESTROGEN: 210.2 pg/mL — AB (ref 60–190)

## 2016-05-31 LAB — TESTOS,TOTAL,FREE AND SHBG (FEMALE)
Sex Hormone Binding Glob.: 10 nmol/L (ref 10–50)
TESTOSTERONE,FREE: 507.2 pg/mL — AB (ref 35.0–155.0)
TESTOSTERONE,TOTAL,LC/MS/MS: 1595 ng/dL — AB (ref 250–1100)

## 2016-06-15 ENCOUNTER — Ambulatory Visit (INDEPENDENT_AMBULATORY_CARE_PROVIDER_SITE_OTHER): Payer: BLUE CROSS/BLUE SHIELD | Admitting: Primary Care

## 2016-06-15 ENCOUNTER — Encounter: Payer: Self-pay | Admitting: Primary Care

## 2016-06-15 VITALS — BP 140/82 | HR 79 | Temp 98.1°F | Ht 71.0 in | Wt 193.8 lb

## 2016-06-15 DIAGNOSIS — I10 Essential (primary) hypertension: Secondary | ICD-10-CM | POA: Diagnosis not present

## 2016-06-15 MED ORDER — LISINOPRIL 20 MG PO TABS: 20.0000 mg | ORAL_TABLET | Freq: Every day | ORAL | 0 refills | Status: DC

## 2016-06-15 NOTE — Progress Notes (Signed)
Pre visit review using our clinic review tool, if applicable. No additional management support is needed unless otherwise documented below in the visit note. 

## 2016-06-15 NOTE — Progress Notes (Signed)
   Subjective:    Patient ID: Charles Fischer, male    DOB: 1995/11/27, 21 y.o.   MRN: [id number]  HPI  Charles Fischer is a 21 year old male who presents today for follow up of hypertension. Currently managed on Lisinopril 10 mg for numerous elevated readings. His blood pressure in the office today is 140/82. He's checking his BP at home which is running low 130's-140's/80's-90's with most of those readings being 140/90's. He denies chest pain, dizziness, fatigue, shortness of breath.   Review of Systems  Constitutional: Negative for fatigue.  Eyes: Negative for visual disturbance.  Respiratory: Negative for cough and shortness of breath.   Cardiovascular: Negative for chest pain.  Neurological: Negative for dizziness and headaches.       Past Medical History:  Diagnosis Date  . Asthma   . Back muscle spasm   . Chronic right shoulder pain   . Cystic acne vulgaris   . Umbilical hernia      Social History   Social History  . Marital status: Single    Spouse name: N/A  . Number of children: N/A  . Years of education: N/A   Occupational History  . Not on file.   Social History Main Topics  . Smoking status: Never Smoker  . Smokeless tobacco: Never Used  . Alcohol use No  . Drug use: No  . Sexual activity: Yes   Other Topics Concern  . Not on file   Social History Narrative   Single.   No children.   Works as a Therapist, sports.    Enjoys working out, playing games, spending time with friends.    Past Surgical History:  Procedure Laterality Date  . ACHILLES TENDON LENGTHENING Bilateral 2009  . FOOT SURGERY  2009-2010  . LASIK  2016    Family History  Problem Relation Age of Onset  . Alcohol abuse Father   . Hyperlipidemia Father   . Cancer Maternal Grandmother     breast    No Known Allergies  Current Outpatient Prescriptions on File Prior to Visit  Medication Sig Dispense Refill  . minocycline (MINOCIN,DYNACIN) 50 MG capsule Take 1 capsule (50 mg total)  by mouth 2 (two) times daily. 180 capsule 1   No current facility-administered medications on file prior to visit.     BP 140/82   Pulse 79   Temp 98.1 F (36.7 C) (Oral)   Ht  (1.803 m)   Wt 193 lb 12.8 oz (87.9 kg)   SpO2 98%   BMI 27.03 kg/m    Objective:   Physical Exam  Constitutional: He appears well-nourished.  Neck: Neck supple.  Cardiovascular: Normal rate and regular rhythm.   Pulmonary/Chest: Effort normal and breath sounds normal.  Skin: Skin is warm and dry.          Assessment & Plan:

## 2016-06-15 NOTE — Patient Instructions (Signed)
We've increased your lisinopril from 10 mg to 20 mg. You may take two of the 10 mg tablets until your bottle is empty, only take one of the 20 mg tablets.  Continue to monitor your blood pressure for the next 2 weeks, I'll message you for those numbers at that time.  Complete lab work prior to leaving today. I will notify you of your results once received.   .It was a pleasure to see you today!

## 2016-06-15 NOTE — Assessment & Plan Note (Signed)
Improved but still above goal, even on home readings.  Increase Lisinopril to 20 mg. Check BMP today. Will call him for BP readings in 2 weeks.

## 2016-06-16 LAB — BASIC METABOLIC PANEL
BUN: 20 mg/dL (ref 6–23)
CALCIUM: 9.6 mg/dL (ref 8.4–10.5)
CO2: 29 mEq/L (ref 19–32)
Chloride: 101 mEq/L (ref 96–112)
Creatinine, Ser: 1.02 mg/dL (ref 0.40–1.50)
GFR: 96.59 mL/min (ref >60.00)
Glucose, Bld: 81 mg/dL (ref 70–99)
Potassium: 4.3 mEq/L (ref 3.5–5.1)
SODIUM: 137 meq/L (ref 135–145)

## 2016-06-28 ENCOUNTER — Ambulatory Visit: Payer: BLUE CROSS/BLUE SHIELD | Admitting: Primary Care

## 2016-06-28 ENCOUNTER — Telehealth: Payer: Self-pay | Admitting: Primary Care

## 2016-06-28 NOTE — Telephone Encounter (Signed)
Pt did not show up for their acute visit.  Do you want to charge No Show Fee? °

## 2016-06-28 NOTE — Telephone Encounter (Signed)
No follow up needed, no fee. 

## 2016-06-29 ENCOUNTER — Telehealth: Payer: Self-pay | Admitting: Primary Care

## 2016-06-29 NOTE — Telephone Encounter (Signed)
-----   Message from Doreene Nest, NP sent at 06/15/2016  4:28 PM EDT ----- Regarding: BP Please check on patient's BP readings since we increased his lisinopril from 10 mg to 20 mg.

## 2016-06-30 ENCOUNTER — Encounter: Payer: Self-pay | Admitting: Primary Care

## 2016-06-30 NOTE — Telephone Encounter (Signed)
Send patient a message through MyChart. 

## 2016-07-01 NOTE — Telephone Encounter (Signed)
Patient send a reply back and stated that he checked the other day. It was 142/88.

## 2016-07-01 NOTE — Telephone Encounter (Signed)
Noted, please schedule him for an office visit in 1 month for BP check.

## 2016-07-02 NOTE — Telephone Encounter (Signed)
Message left for patient to return my call.  Send patient a message through MyChart 

## 2016-07-05 NOTE — Telephone Encounter (Signed)
Noted. Patient viewed the message through MyChart.

## 2016-07-20 ENCOUNTER — Other Ambulatory Visit: Payer: Self-pay | Admitting: Primary Care

## 2016-07-20 DIAGNOSIS — I10 Essential (primary) hypertension: Secondary | ICD-10-CM

## 2016-07-20 MED ORDER — LISINOPRIL 20 MG PO TABS: 20.0000 mg | ORAL_TABLET | Freq: Every day | ORAL | 0 refills | Status: DC

## 2016-07-23 ENCOUNTER — Ambulatory Visit (INDEPENDENT_AMBULATORY_CARE_PROVIDER_SITE_OTHER): Payer: BLUE CROSS/BLUE SHIELD | Admitting: Primary Care

## 2016-07-23 ENCOUNTER — Encounter: Payer: Self-pay | Admitting: Primary Care

## 2016-07-23 VITALS — BP 142/86 | HR 83 | Temp 98.5°F | Ht 71.0 in | Wt 190.4 lb

## 2016-07-23 DIAGNOSIS — E349 Endocrine disorder, unspecified: Secondary | ICD-10-CM | POA: Diagnosis not present

## 2016-07-23 DIAGNOSIS — I10 Essential (primary) hypertension: Secondary | ICD-10-CM | POA: Diagnosis not present

## 2016-07-23 MED ORDER — HYDROCHLOROTHIAZIDE 25 MG PO TABS: 25.0000 mg | ORAL_TABLET | Freq: Every day | ORAL | 0 refills | Status: DC

## 2016-07-23 NOTE — Assessment & Plan Note (Signed)
Above goal today, also on home readings. Mom did notify him of positive family history of hypertension. Will add in HCTZ 25 mg, continue lisinopril 20 mg. Will call for BP readings in 2 weeks.

## 2016-07-23 NOTE — Patient Instructions (Signed)
Start hydrochlorothiazide 25 mg tablets for high blood pressure. Continue lisinopril 20 mg for high blood pressure.  Monitor your blood pressure over the next 2 weeks, I'll call you for readings at that time.  Complete lab work prior to leaving today. I will notify you of your results once received.   It was a pleasure to see you today!

## 2016-07-23 NOTE — Assessment & Plan Note (Signed)
Needs estrogen rechecked per Urology, lab pending.

## 2016-07-23 NOTE — Progress Notes (Signed)
   Subjective:    Patient ID: Charles ChangDerrick M Fischer, male    DOB: 07/18/1995, 21 y.o.   MRN: [id number]  HPI  [name] is a 21 year old male who presents today for follow up.  1) Essential Hypertension: Currently under treatment for testosterone deficiency through Urology. His blood pressure has been elevated before treatment for testosterone deficiency. He is currently managed on lisinopril 20 mg.   His BP in the office today is 142/86.He's checking his BP at home occasionally and is getting readings of 140/80's. He denies dizziness, chest pain, shortness of breath.   2) Testosterone Deficiency: Currently following with Urology and is needing a repeat estrogen check. He is due in June for follow up. He denies chest pain, dizziness, weakness.  Review of Systems  Constitutional: Negative for fatigue.  Respiratory: Negative for shortness of breath.   Cardiovascular: Negative for chest pain.  Neurological: Negative for dizziness and headaches.       Past Medical History:  Diagnosis Date  . Asthma   . Back muscle spasm   . Chronic right shoulder pain   . Cystic acne vulgaris   . Umbilical hernia      Social History   Social History  . Marital status: Single    Spouse name: N/A  . Number of children: N/A  . Years of education: N/A   Occupational History  . Not on file.   Social History Main Topics  . Smoking status: Never Smoker  . Smokeless tobacco: Never Used  . Alcohol use No  . Drug use: No  . Sexual activity: Yes   Other Topics Concern  . Not on file   Social History Narrative   Single.   No children.   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    Enjoys working out, playing games, spending time with friends.    Past Surgical History:  Procedure Laterality Date  . ACHILLES TENDON LENGTHENING Bilateral 2009  . FOOT SURGERY  2009-2010  . LASIK  2016    Family History  Problem Relation Age of Onset  . Alcohol abuse Father   . Hyperlipidemia Father   . Cancer Maternal  Grandmother        breast    No Known Allergies  Current Outpatient Prescriptions on File Prior to Visit  Medication Sig Dispense Refill  . lisinopril (PRINIVIL,ZESTRIL) 20 MG tablet Take 1 tablet (20 mg total) by mouth daily. 90 tablet 0  . minocycline (MINOCIN,DYNACIN) 50 MG capsule Take 1 capsule (50 mg total) by mouth 2 (two) times daily. 180 capsule 1   No current facility-administered medications on file prior to visit.     BP (!) 142/86   Pulse 83   Temp 98.5 F (36.9 C) (Oral)   Ht 5\' 11"  (1.803 m)   Wt 190 lb 6.4 oz (86.4 kg)   SpO2 99%   BMI 26.56 kg/m    Objective:   Physical Exam  Constitutional: He appears well-nourished.  Neck: Neck supple.  Cardiovascular: Normal rate and regular rhythm.   Pulmonary/Chest: Effort normal and breath sounds normal.  Skin: Skin is warm and dry.          Assessment & Plan:

## 2016-07-29 LAB — ESTROGENS, TOTAL: ESTROGEN: 184.8 pg/mL (ref 60–190)

## 2016-08-06 ENCOUNTER — Telehealth: Payer: Self-pay | Admitting: Primary Care

## 2016-08-06 NOTE — Telephone Encounter (Signed)
-----   Message from Doreene NestKatherine K Madoline Bhatt, NP sent at 07/23/2016  7:32 AM EDT ----- Regarding: BP Please check on BP readings since we added HCTZ 25 mg to Lisinopril 20 mg.

## 2016-08-09 NOTE — Telephone Encounter (Addendum)
Send patient a message through MyChart. 

## 2016-08-11 NOTE — Telephone Encounter (Signed)
Message left for patient to return my call.  

## 2016-08-16 NOTE — Telephone Encounter (Signed)
Spoken to patient and he has not been checking his blood pressure.

## 2016-08-17 NOTE — Telephone Encounter (Signed)
Noted  

## 2016-10-04 ENCOUNTER — Other Ambulatory Visit: Payer: Self-pay | Admitting: Primary Care

## 2016-10-04 DIAGNOSIS — I10 Essential (primary) hypertension: Secondary | ICD-10-CM

## 2016-10-04 MED ORDER — LISINOPRIL 20 MG PO TABS: 20.0000 mg | ORAL_TABLET | Freq: Every day | ORAL | 0 refills | Status: DC

## 2016-10-25 ENCOUNTER — Ambulatory Visit (INDEPENDENT_AMBULATORY_CARE_PROVIDER_SITE_OTHER): Payer: BLUE CROSS/BLUE SHIELD | Admitting: Primary Care

## 2016-10-25 ENCOUNTER — Encounter: Payer: Self-pay | Admitting: Primary Care

## 2016-10-25 ENCOUNTER — Ambulatory Visit (INDEPENDENT_AMBULATORY_CARE_PROVIDER_SITE_OTHER)
Admission: RE | Admit: 2016-10-25 | Discharge: 2016-10-25 | Disposition: A | Discharge status: Home / Self Care | Payer: BLUE CROSS/BLUE SHIELD | Source: Ambulatory Visit | Attending: Primary Care | Admitting: Primary Care

## 2016-10-25 VITALS — BP 134/82 | HR 89 | Temp 99.1°F | Ht 71.0 in | Wt 187.8 lb

## 2016-10-25 DIAGNOSIS — M25572 Pain in left ankle and joints of left foot: Secondary | ICD-10-CM

## 2016-10-25 NOTE — Patient Instructions (Addendum)
Complete xray(s) prior to leaving today.   Continue icing and elevating your ankle. Refrain from applying any weight to your ankle, continue to use the crutches.  I'll be in touch later today.  It was a pleasure to see you today!

## 2016-10-25 NOTE — Progress Notes (Signed)
Subjective:    Patient ID: Charles Fischer, male    DOB: 1995-08-26, 21 y.o.   MRN: [id number]  HPI  Charles Fischer is a 21 year old male who presents today with a chief complaint of ankle pain. His pain is located to the left ankle. He fell and twisted his left ankle three days ago while playing volleyball. He noticed immediate swelling after the injury, bruising later. He's been applying ice, elevating his ankle, and using a boot at night when sleeping. He's taken tylenol and ibuprofen without improvement. He's been using crutches since the injury and can't put any weight on his ankle without significant pain.   He has a history of bone spur removal and foot surgery to his bilateral feet.   Review of Systems  Constitutional: Negative for fever.  Musculoskeletal:       Left ankle pain and swelling  Skin: Positive for color change.  Neurological: Negative for weakness.       Past Medical History:  Diagnosis Date  . Asthma   . Back muscle spasm   . Chronic right shoulder pain   . Cystic acne vulgaris   . Umbilical hernia      Social History   Social History  . Marital status: Single    Spouse name: N/A  . Number of children: N/A  . Years of education: N/A   Occupational History  . Not on file.   Social History Main Topics  . Smoking status: Never Smoker  . Smokeless tobacco: Never Used  . Alcohol use No  . Drug use: No  . Sexual activity: Yes   Other Topics Concern  . Not on file   Social History Narrative   Single.   No children.   Works as a Therapist, sports.    Enjoys working out, playing games, spending time with friends.    Past Surgical History:  Procedure Laterality Date  . ACHILLES TENDON LENGTHENING Bilateral 2009  . FOOT SURGERY  2009-2010  . LASIK  2016    Family History  Problem Relation Age of Onset  . Alcohol abuse Father   . Hyperlipidemia Father   . Cancer Maternal Grandmother        breast    No Known Allergies  Current Outpatient  Prescriptions on File Prior to Visit  Medication Sig Dispense Refill  . hydrochlorothiazide (HYDRODIURIL) 25 MG tablet Take 1 tablet (25 mg total) by mouth daily. 90 tablet 0  . lisinopril (PRINIVIL,ZESTRIL) 20 MG tablet Take 1 tablet (20 mg total) by mouth daily. 90 tablet 0  . minocycline (MINOCIN,DYNACIN) 50 MG capsule Take 1 capsule (50 mg total) by mouth 2 (two) times daily. 180 capsule 1   No current facility-administered medications on file prior to visit.     BP 134/82   Pulse 89   Temp 99.1 F (37.3 C) (Oral)   Ht 5\' 11"  (1.803 m)   Wt 187 lb 12.8 oz (85.2 kg)   SpO2 98%   BMI 26.19 kg/m    Objective:   Physical Exam  Constitutional: He appears well-nourished.  Neck: Neck supple.  Cardiovascular: Normal rate.   Pulses:      Dorsalis pedis pulses are 2+ on the left side.       Posterior tibial pulses are 2+ on the left side.  Pulmonary/Chest: Effort normal.  Musculoskeletal:       Left ankle: He exhibits decreased range of motion, swelling and ecchymosis. Tenderness. Lateral malleolus and medial malleolus tenderness  found. Achilles tendon exhibits no pain.  Decrease ROM in all planes  Skin: Skin is warm and dry.  Mild, healing bruising to left lateral ankle.          Assessment & Plan:  Acute Ankle Pain:  Located to left ankle since injury three days ago. Exam today with suspicion for fracture. Xray pending today. Continue non weight bearing with crutches.  Will send to ortho urgently if xray's positive. Continue ice, elevation, NSAIDs. Work note provided.  Charles Sheldon, NP

## 2016-10-26 ENCOUNTER — Encounter: Payer: Self-pay | Admitting: Primary Care

## 2016-10-26 ENCOUNTER — Telehealth: Payer: Self-pay | Admitting: Primary Care

## 2016-10-26 NOTE — Telephone Encounter (Signed)
Spoken to patient and notified him that the form has been left in the front office for patient to pick up.

## 2016-10-26 NOTE — Telephone Encounter (Signed)
Pt called to request a note for visit explaining why you were seen and the x-ray that was taken for an accidental ins plan he needs to file after the visit. Any questions call pt on cell phone.

## 2016-10-26 NOTE — Telephone Encounter (Signed)
Noted. Letter completed and placed in Chan's in box.

## 2016-11-17 ENCOUNTER — Other Ambulatory Visit: Payer: Self-pay | Admitting: Primary Care

## 2016-11-17 DIAGNOSIS — I10 Essential (primary) hypertension: Secondary | ICD-10-CM

## 2016-11-22 ENCOUNTER — Ambulatory Visit (INDEPENDENT_AMBULATORY_CARE_PROVIDER_SITE_OTHER): Payer: BLUE CROSS/BLUE SHIELD | Admitting: Primary Care

## 2016-11-22 ENCOUNTER — Encounter: Payer: Self-pay | Admitting: Primary Care

## 2016-11-22 VITALS — BP 120/70 | HR 93 | Temp 98.3°F | Ht 71.0 in | Wt 189.1 lb

## 2016-11-22 DIAGNOSIS — G4700 Insomnia, unspecified: Secondary | ICD-10-CM | POA: Diagnosis not present

## 2016-11-22 DIAGNOSIS — S93402D Sprain of unspecified ligament of left ankle, subsequent encounter: Secondary | ICD-10-CM | POA: Diagnosis not present

## 2016-11-22 DIAGNOSIS — E349 Endocrine disorder, unspecified: Secondary | ICD-10-CM

## 2016-11-22 NOTE — Assessment & Plan Note (Signed)
Present for the past 3-4 weeks. Doesn't seem to be anxiety related. Discussed that he needs to improve his bedtime hygiene including avoiding TV/computer/phone within 1 hour prior to bed. Discussed to try taking hot showers before bed. Discussed to limit caffeine after 12 PM daily. Continue melatonin, take 1-2 hours prior to bedtime. He will update if no improvement after these measures.

## 2016-11-22 NOTE — Assessment & Plan Note (Signed)
Not currently on testosterone replacement.

## 2016-11-22 NOTE — Patient Instructions (Addendum)
No caffeine after 12 pm daily.  Start back with regular exercise, slowly work up to this.  Get out of bed if you can't fall asleep within one hour.   Avoid looking at a computer, tablet, phone, TV one hour prior to bedtime.   Continue Melatonin 10 mg tablets. Take 1 tablet by mouth one hour prior to bedtime.   Your ankle has healed well, advance exercise as tolerated.   It was a pleasure to see you today!   Insomnia Insomnia is a sleep disorder that makes it difficult to fall asleep or to stay asleep. Insomnia can cause tiredness (fatigue), low energy, difficulty concentrating, mood swings, and poor performance at work or school. There are three different ways to classify insomnia:  Difficulty falling asleep.  Difficulty staying asleep.  Waking up too early in the morning.  Any type of insomnia can be long-term (chronic) or short-term (acute). Both are common. Short-term insomnia usually lasts for three months or less. Chronic insomnia occurs at least three times a week for longer than three months. What are the causes? Insomnia may be caused by another condition, situation, or substance, such as:  Anxiety.  Certain medicines.  Gastroesophageal reflux disease (GERD) or other gastrointestinal conditions.  Asthma or other breathing conditions.  Restless legs syndrome, sleep apnea, or other sleep disorders.  Chronic pain.  Menopause. This may include hot flashes.  Stroke.  Abuse of alcohol, tobacco, or illegal drugs.  Depression.  Caffeine.  Neurological disorders, such as Alzheimer disease.  An overactive thyroid (hyperthyroidism).  The cause of insomnia may not be known. What increases the risk? Risk factors for insomnia include:  Gender. Women are more commonly affected than men.  Age. Insomnia is more common as you get older.  Stress. This may involve your professional or personal life.  Income. Insomnia is more common in people with lower  income.  Lack of exercise.  Irregular work schedule or night shifts.  Traveling between different time zones.  What are the signs or symptoms? If you have insomnia, trouble falling asleep or trouble staying asleep is the main symptom. This may lead to other symptoms, such as:  Feeling fatigued.  Feeling nervous about going to sleep.  Not feeling rested in the morning.  Having trouble concentrating.  Feeling irritable, anxious, or depressed.  How is this treated? Treatment for insomnia depends on the cause. If your insomnia is caused by an underlying condition, treatment will focus on addressing the condition. Treatment may also include:  Medicines to help you sleep.  Counseling or therapy.  Lifestyle adjustments.  Follow these instructions at home:  Take medicines only as directed by your health care provider.  Keep regular sleeping and waking hours. Avoid naps.  Keep a sleep diary to help you and your health care provider figure out what could be causing your insomnia. Include: ? When you sleep. ? When you wake up during the night. ? How well you sleep. ? How rested you feel the next day. ? Any side effects of medicines you are taking. ? What you eat and drink.  Make your bedroom a comfortable place where it is easy to fall asleep: ? Put up shades or special blackout curtains to block light from outside. ? Use a white noise machine to block noise. ? Keep the temperature cool.  Exercise regularly as directed by your health care provider. Avoid exercising right before bedtime.  Use relaxation techniques to manage stress. Ask your health care provider to suggest  some techniques that may work well for you. These may include: ? Breathing exercises. ? Routines to release muscle tension. ? Visualizing peaceful scenes.  Cut back on alcohol, caffeinated beverages, and cigarettes, especially close to bedtime. These can disrupt your sleep.  Do not overeat or eat spicy  foods right before bedtime. This can lead to digestive discomfort that can make it hard for you to sleep.  Limit screen use before bedtime. This includes: ? Watching TV. ? Using your smartphone, tablet, and computer.  Stick to a routine. This can help you fall asleep faster. Try to do a quiet activity, brush your teeth, and go to bed at the same time each night.  Get out of bed if you are still awake after 15 minutes of trying to sleep. Keep the lights down, but try reading or doing a quiet activity. When you feel sleepy, go back to bed.  Make sure that you drive carefully. Avoid driving if you feel very sleepy.  Keep all follow-up appointments as directed by your health care provider. This is important. Contact a health care provider if:  You are tired throughout the day or have trouble in your daily routine due to sleepiness.  You continue to have sleep problems or your sleep problems get worse. Get help right away if:  You have serious thoughts about hurting yourself or someone else. This information is not intended to replace advice given to you by your health care provider. Make sure you discuss any questions you have with your health care provider. Document Released: 02/13/2000 Document Revised: 07/18/2015 Document Reviewed: 11/16/2013 Elsevier Interactive Patient Education  [employer].

## 2016-11-22 NOTE — Progress Notes (Signed)
Subjective:    Patient ID: Charles Fischer, male    DOB: 1995-05-14, 21 y.o.   MRN: [id number]  HPI  Charles Fischer is a 21 year old male who presents today with a history of testosterone deficiency and recent ankle sprain who presents today with a chief complaint of insomnia. He's experienced insomnia for the past  3 weeks to 1 month. He will go to bed around 10-10:30 pm, lay awake until 2 am, falls asleep and then has to wake for  work at 5 am. He will "toss and turn" most nights until he can fall asleep. He will play games on the computer, watch TV before bed. He's tried taking a [employer] sleeping aid and Melatonin before bed with some improvement, he will fall asleep within 1-2 hours after taking the sleeping aids, will sometimes wake back up.   2) Ankle Sprain: Presented to our office on 10/25/16 with a chief complaint of left ankle pain and swelling since injury after playing volleyball. He underwent evaluation including x-ray which was negative for fracture or dislocation. He was treated with ice, compression, elevation, crutches. Today his ankle has significantly improved. He denies decrease in range of motion, erythema, swelling, pain. Ambulatory without difficulty.   Review of Systems  Constitutional: Negative for fatigue.  Musculoskeletal: Negative for arthralgias.  Skin: Negative for color change.  Psychiatric/Behavioral: Positive for sleep disturbance. The patient is not nervous/anxious.        Past Medical History:  Diagnosis Date  . Asthma   . Back muscle spasm   . Chronic right shoulder pain   . Cystic acne vulgaris   . Umbilical hernia      Social History   Social History  . Marital status: Single    Spouse name: N/A  . Number of children: N/A  . Years of education: N/A   Occupational History  . Not on file.   Social History Main Topics  . Smoking status: Never Smoker  . Smokeless tobacco: Never Used  . Alcohol use No  . Drug use: No  . Sexual  activity: Yes   Other Topics Concern  . Not on file   Social History Narrative   Single.   No children.   Works as a Therapist, sports.    Enjoys working out, playing games, spending time with friends.    Past Surgical History:  Procedure Laterality Date  . ACHILLES TENDON LENGTHENING Bilateral 2009  . FOOT SURGERY  2009-2010  . LASIK  2016    Family History  Problem Relation Age of Onset  . Alcohol abuse Father   . Hyperlipidemia Father   . Cancer Maternal Grandmother        breast    No Known Allergies  Current Outpatient Prescriptions on File Prior to Visit  Medication Sig Dispense Refill  . hydrochlorothiazide (HYDRODIURIL) 25 MG tablet TAKE 1 TABLET BY MOUTH EVERY DAY 90 tablet 1  . lisinopril (PRINIVIL,ZESTRIL) 20 MG tablet Take 1 tablet (20 mg total) by mouth daily. 90 tablet 0  . minocycline (MINOCIN,DYNACIN) 50 MG capsule Take 1 capsule (50 mg total) by mouth 2 (two) times daily. 180 capsule 1   No current facility-administered medications on file prior to visit.     BP 120/70   Pulse 93   Temp 98.3 F (36.8 C) (Oral)   Ht  (1.803 m)   Wt 189 lb 1.9 oz (85.8 kg)   SpO2 99%   BMI 26.38 kg/m    Objective:  Physical Exam  Constitutional: He appears well-nourished.  Neck: Neck supple.  Cardiovascular: Normal rate and regular rhythm.   Pulmonary/Chest: Effort normal and breath sounds normal.  Musculoskeletal:       Left ankle: He exhibits normal range of motion and no swelling. No tenderness.  Skin: Skin is warm and dry.          Assessment & Plan:  Ankle sprain:  Injury to left ankle in late August 2018. Exam today unremarkable. Appears ankle has healed well. Discussed to advance exercise as tolerated.  Morrie Sheldon, NP

## 2017-01-30 ENCOUNTER — Other Ambulatory Visit: Payer: Self-pay | Admitting: Primary Care

## 2017-01-30 DIAGNOSIS — L700 Acne vulgaris: Secondary | ICD-10-CM

## 2017-01-31 NOTE — Telephone Encounter (Signed)
Please call patient: When did he last take the minocycline for his acne? Did he experience another flare?

## 2017-01-31 NOTE — Telephone Encounter (Signed)
Ok to refill? Electronically refill request for minocycline (MINOCIN,DYNACIN) 50 MG capsule  Last prescribed on 03/05/2016. Last seen on 11/22/2016

## 2017-02-01 NOTE — Telephone Encounter (Signed)
Noted, will discuss at upcoming visit.

## 2017-02-01 NOTE — Telephone Encounter (Signed)
Spoken and notified patient of Charles Fischer's comments. Patient stated yes and this helps. Would like refill.

## 2017-02-01 NOTE — Telephone Encounter (Signed)
Spoken to patient. He experience a another flare for the past month, more on his back.  FYI. Patient is coming in for a follow up on his ankle on 02/03/2017

## 2017-02-03 ENCOUNTER — Encounter: Payer: Self-pay | Admitting: Primary Care

## 2017-02-03 ENCOUNTER — Ambulatory Visit: Payer: Managed Care, Other (non HMO) | Admitting: Primary Care

## 2017-02-03 VITALS — BP 124/70 | HR 83 | Temp 98.2°F | Ht 71.0 in | Wt 189.0 lb

## 2017-02-03 DIAGNOSIS — M25572 Pain in left ankle and joints of left foot: Secondary | ICD-10-CM | POA: Diagnosis not present

## 2017-02-03 DIAGNOSIS — L700 Acne vulgaris: Secondary | ICD-10-CM

## 2017-02-03 DIAGNOSIS — I10 Essential (primary) hypertension: Secondary | ICD-10-CM

## 2017-02-03 NOTE — Assessment & Plan Note (Signed)
Discussed to stop minocycline for at least a three month break when current acne resolves.

## 2017-02-03 NOTE — Progress Notes (Signed)
Subjective:    Patient ID: Charles Fischer, male    DOB: 06/29/1995, 21 y.o.   MRN: [id number]  HPI  Charles Fischer is a 21 year old male who presents today for follow up.  1) Acne: Currently managed on minocycline 100 mg once daily. He took a break off of his medication from August and September, resumed this in mid October as his back acne started acting up. He plans on stopping his medication once his back acne clears up. 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  2) Essential Hypertension: Currently managed on lisinopril 20 mg and HCTZ 25 mg. He denies dizziness, chest pain.  BP Readings from Last 3 Encounters:  02/03/17 124/70  11/22/16 120/70  10/25/16 134/82    3) Ankle Pain: Present since injury in late August 2018. He twisted the ankle while playing volleyball. Xray's during that visit were unremarkable. Overall his ankle improved but experiences pain to left lateral malleolus, also proximal.    He can squat and put weight on his ankle, but will experience sharp pains with dorsiflexion and rotation. Also with decrease in ROM with dorsiflexion. He doesn't wrap his ankle. He's been working out at [employer] gym and will notice pain with various exercises. He took at break from the gym and never noticed improvement. He denies swelling, re-injury, color changes.   Review of Systems  Eyes: Negative for visual disturbance.  Respiratory: Negative for shortness of breath.   Cardiovascular: Negative for chest pain.  Skin:       Acne to back       Past Medical History:  Diagnosis Date  . Asthma   . Back muscle spasm   . Chronic right shoulder pain   . Cystic acne vulgaris   . Umbilical hernia      Social History   Socioeconomic History  . Marital status: Single    Spouse name: Not on file  . Number of children: Not on file  . Years of education: Not on file  . Highest education level: Not on file  Social Needs  . Financial resource strain: Not on file  .  Food insecurity - worry: Not on file  . Food insecurity - inability: Not on file  . Transportation needs - medical: Not on file  . Transportation needs - non-medical: Not on file  Occupational History  . Not on file  Tobacco Use  . Smoking status: Never Smoker  . Smokeless tobacco: Never Used  Substance and Sexual Activity  . Alcohol use: No    Alcohol/week: 0.0 oz  . Drug use: No  . Sexual activity: Yes  Other Topics Concern  . Not on file  Social History Narrative   Single.   No children.   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    Enjoys working out, playing games, spending time with friends.    Past Surgical History:  Procedure Laterality Date  . ACHILLES TENDON LENGTHENING Bilateral 2009  . FOOT SURGERY  2009-2010  . LASIK  2016    Family History  Problem Relation Age of Onset  . Alcohol abuse Father   . Hyperlipidemia Father   . Cancer Maternal Grandmother        breast    No Known Allergies  Current Outpatient Medications on File Prior to Visit  Medication Sig Dispense Refill  . hydrochlorothiazide (HYDRODIURIL) 25 MG tablet TAKE 1 TABLET BY MOUTH EVERY DAY 90 tablet 1  . lisinopril (PRINIVIL,ZESTRIL) 20 MG tablet  Take 1 tablet (20 mg total) by mouth daily. 90 tablet 0  . minocycline (MINOCIN,DYNACIN) 50 MG capsule Take 1 capsule (50 mg total) by mouth 2 (two) times daily. (Patient not taking: Reported on 02/03/2017) 180 capsule 1   No current facility-administered medications on file prior to visit.     BP 124/70   Pulse 83   Temp 98.2 F (36.8 C) (Oral)   Ht 5\' 11"  (1.803 m)   Wt 189 lb (85.7 kg)   SpO2 99%   BMI 26.36 kg/m    Objective:   Physical Exam  Constitutional: He appears well-nourished.  Cardiovascular: Normal rate.  Pulses:      Dorsalis pedis pulses are 2+ on the left side.       Posterior tibial pulses are 2+ on the left side.  Pulmonary/Chest: Effort normal.  Musculoskeletal:       Left ankle: He exhibits decreased range of motion. He exhibits  no swelling. No tenderness.  Mild decrease in ROM with pain to dorsiflexion. No pain or decrease in ROM with rotation, plantar flexion.   Skin: Skin is warm and dry. No erythema.          Assessment & Plan:  Acute Ankle Pain:  Overall better since injury, but with residual sharp, pulling pain to left lateral malleolus with certain movements. Exam today with decrease in ROM as mentioned above. Could be ligamental/tendon tear or chronic inflammation. He declines PT as he's been exercising and stretching at the gym. Will have him see Dr. Patsy Lageropland for further evaluation.  Morrie Sheldonlark,Katherine Kendal, NP

## 2017-02-03 NOTE — Patient Instructions (Signed)
Schedule a visit with Dr. Patsy Lageropland for further evaluation of your ankle.   Refrain from exercise that exacerbate your symptoms.  It was a pleasure to see you today!

## 2017-02-03 NOTE — Assessment & Plan Note (Signed)
Stable in the office today.  Continue current regimen. 

## 2017-02-08 ENCOUNTER — Ambulatory Visit: Payer: Managed Care, Other (non HMO) | Admitting: Family Medicine

## 2017-02-09 ENCOUNTER — Ambulatory Visit: Payer: Managed Care, Other (non HMO) | Admitting: Family Medicine

## 2017-02-09 ENCOUNTER — Encounter: Payer: Self-pay | Admitting: Family Medicine

## 2017-02-09 VITALS — BP 120/62 | HR 88 | Temp 98.6°F | Ht 71.0 in | Wt 182.5 lb

## 2017-02-09 DIAGNOSIS — J069 Acute upper respiratory infection, unspecified: Secondary | ICD-10-CM | POA: Diagnosis not present

## 2017-02-09 DIAGNOSIS — S93402S Sprain of unspecified ligament of left ankle, sequela: Secondary | ICD-10-CM | POA: Diagnosis not present

## 2017-02-09 DIAGNOSIS — IMO0001 Reserved for inherently not codable concepts without codable children: Secondary | ICD-10-CM

## 2017-02-09 NOTE — Progress Notes (Signed)
Dr. Karleen HampshireSpencer T. Isaak Delmundo, MD, CAQ [facility] [address] [place], [id number] Phone: [phone] Fax: [phone]  02/09/2017  Patient: Charles Fischer, MRN: [id number], DOB: 06/30/1995, 21 y.o.  Primary Physician:  Doreene Nestlark, Katherine K, NP   Chief Complaint  Patient presents with  . Ankle Pain    Sprained in September-Still having issues  . Cough    with yellowish/green phelgm  . Sore Throat  . Nasal Congestion   Subjective:   Charles Fischer is a 21 y.o. very pleasant male patient who presents with the following:  Pleasant young man who Mrs. Clark asked me to evaluate for ongoing problems after a bad ankle sprain in September.  It looks as if he was initially evaluated October 25, 2016, and he tells me that he rolled his ankle and had a bad lateral ankle sprain while playing volleyball.  He was unable to walk and had severe swelling and bruising.  He was nonweightbearing immediately after injury and was warmed nonweightbearing for approximately 1 month after this.  He tells me that he wore a cam walker boot for at least 3 weeks after his initial injury.  History is significant for previous arch reconstruction of some type, exact operation unclear.  Today he still has some aching and some mild decrease in motion.  He has been active at the gym, but he does note that it still hurts him to play basketball as well as running and other jumping sports.  He also has cold symptoms with a runny nose, sore throat, cough, as well as some congestion.  Ankle sprain and cold.  Rolled lateral ankle - a little bit better and weight for about a month. Had a boot for about 3 weeks. Had foot surgery   Past Medical History, Surgical History, Social History, Family History, Problem List, Medications, and Allergies have been reviewed and updated if relevant.  Patient Active Problem List   Diagnosis Date Noted  . Insomnia 11/22/2016  . Essential  hypertension 05/25/2016  . Testosterone deficiency 04/02/2016  . Annual physical exam 02/28/2015  . Acne cystica 10/03/2014  . Asthma 10/03/2014  . Allergic rhinitis 12/19/2013    Past Medical History:  Diagnosis Date  . Asthma   . Back muscle spasm   . Chronic right shoulder pain   . Cystic acne vulgaris   . Umbilical hernia     Past Surgical History:  Procedure Laterality Date  . ACHILLES TENDON LENGTHENING Bilateral 2009  . FOOT SURGERY  2009-2010  . LASIK  2016    Social History   Socioeconomic History  . Marital status: Single    Spouse name: Not on file  . Number of children: Not on file  . Years of education: Not on file  . Highest education level: Not on file  Social Needs  . Financial resource strain: Not on file  . Food insecurity - worry: Not on file  . Food insecurity - inability: Not on file  . Transportation needs - medical: Not on file  . Transportation needs - non-medical: Not on file  Occupational History  . Not on file  Tobacco Use  . Smoking status: Never Smoker  . Smokeless tobacco: Never Used  Substance and Sexual Activity  . Alcohol use: No    Alcohol/week: 0.0 oz  . Drug use: No  . Sexual activity: Yes  Other Topics Concern  . Not on file  Social History Narrative   Single.  No children.   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    Enjoys working out, playing games, spending time with friends.    Family History  Problem Relation Age of Onset  . Alcohol abuse Father   . Hyperlipidemia Father   . Cancer Maternal Grandmother        breast    No Known Allergies  Medication list reviewed and updated in full in [facility].   GEN: No acute illnesses, no fevers, chills. GI: No n/v/d, eating normally Pulm: No SOB Interactive and getting along well at home.  Otherwise, ROS is as per the HPI.  Objective:   BP 120/62   Pulse 88   Temp 98.6 F (37 C) (Oral)   Ht 5\' 11"  (1.803 m)   Wt 182 lb 8 oz (82.8 kg)   BMI 25.45 kg/m   GEN:  WDWN, NAD, Non-toxic, A & O x 3 HEENT: Atraumatic, Normocephalic. Neck supple. No masses, No LAD. Runny nose. Ears and Nose: No external deformity. CV: RRR, No M/G/R. No JVD. No thrill. No extra heart sounds. PULM: CTA B, no wheezes, crackles, rhonchi. No retractions. No resp. distress. No accessory muscle use. EXTR: No c/c/e NEURO Normal gait.  PSYCH: Normally interactive. Conversant. Not depressed or anxious appearing.  Calm demeanor.   In the left ankle compared to the right, as well as the foot, there is no bony tenderness at all.  There is slight increased to give on anterior drawer testing which causes some apprehension as well as mild discomfort.  No tenderness on any ligament.  Ankle range of motion is modestly decreased compared to the right, somewhat worse on eversion.  Strength testing is 5 out of 5 with the exception of eversion which is 4 out of 5.  Dorsiflexion is 4+/5.  Laboratory and Imaging Data:  Assessment and Plan:   Grade 2 ankle sprain, left, sequela  Acute URI  With a severe ankle sprain such as this, at least grade 2, possible grade 3 ankle sprain, I would expect it will take an total of 6 months before the patient is back to baseline.  I reviewed some range of motion and strengthening to do with the patient, and he is already going back to the gym regularly.  Offered him formal physical therapy, which would probably hasten his recovery, but he wanted to work on this on his own.  At 21, he should do well.  Supportive care for viral syndrome.  Follow-up: No Follow-up on file.  Signed,  Elpidio GaleaSpencer T. Aureliano Oshields, MD   Allergies as of 02/09/2017   No Known Allergies     Medication List        Accurate as of 02/09/17 11:59 PM. Always use your most recent med list.          hydrochlorothiazide 25 MG tablet Commonly known as:  HYDRODIURIL TAKE 1 TABLET BY MOUTH EVERY DAY   lisinopril 20 MG tablet Commonly known as:  PRINIVIL,ZESTRIL Take 1 tablet (20 mg  total) by mouth daily.   minocycline 50 MG capsule Commonly known as:  MINOCIN,DYNACIN Take 1 capsule (50 mg total) by mouth 2 (two) times daily.

## 2017-03-09 ENCOUNTER — Encounter: Payer: Self-pay | Admitting: Primary Care

## 2017-03-09 ENCOUNTER — Ambulatory Visit: Payer: Managed Care, Other (non HMO) | Admitting: Primary Care

## 2017-03-09 VITALS — BP 114/68 | HR 66 | Temp 97.8°F | Wt 180.5 lb

## 2017-03-09 DIAGNOSIS — I10 Essential (primary) hypertension: Secondary | ICD-10-CM | POA: Diagnosis not present

## 2017-03-09 DIAGNOSIS — E349 Endocrine disorder, unspecified: Secondary | ICD-10-CM | POA: Diagnosis not present

## 2017-03-09 NOTE — Assessment & Plan Note (Signed)
Overall feeling well. Will check estrogen and testosterone levels. He will return prior to 10 am for labs. Will send to Urology.

## 2017-03-09 NOTE — Assessment & Plan Note (Signed)
BP stable of of medications. Suspect levels were secondary to anxiety and stress from work. Continue to monitor off medications.

## 2017-03-09 NOTE — Patient Instructions (Signed)
Schedule a lab only appointment to return between before 10 am.  Continue to keep an eye on your blood pressure as discussed.  It was a pleasure to see you today!

## 2017-03-09 NOTE — Progress Notes (Signed)
Subjective:    Patient ID: Charles ChangDerrick M Fischer, male    DOB: 12/07/1995, 22 y.o.   MRN: [id number]  HPI  [name] is a 22 year old male who would like hormone levels checked. He has a history of testosterone deficiency and was previously following with Urology. He would like levels including estrogen and testosterone checked. His last injection of testosterone was in March 2018 per Urology.   He denies fatigue. He's mostly wanting labs tested for general knowledge.   2) Essential Hypertension: Currently managed on lisinopril 20 mg and HCTZ 25 mg for numerous visits with elevated readings. During that time he was working 80 hours a week and was under a lot of stress. He's been off of both medications for the past three months. He's checking his BP at home and has been getting readings of 120-80's. He's under a lot less stress at work.   BP Readings from Last 3 Encounters:  03/09/17 114/68  02/09/17 120/62  02/03/17 124/70     Review of Systems  Constitutional: Negative for fatigue.  Respiratory: Negative for shortness of breath.   Cardiovascular: Negative for chest pain.  Neurological: Negative for dizziness and weakness.       Past Medical History:  Diagnosis Date  . Asthma   . Back muscle spasm   . Chronic right shoulder pain   . Cystic acne vulgaris   . Umbilical hernia      Social History   Socioeconomic History  . Marital status: Single    Spouse name: Not on file  . Number of children: Not on file  . Years of education: Not on file  . Highest education level: Not on file  Social Needs  . Financial resource strain: Not on file  . Food insecurity - worry: Not on file  . Food insecurity - inability: Not on file  . Transportation needs - medical: Not on file  . Transportation needs - non-medical: Not on file  Occupational History  . Not on file  Tobacco Use  . Smoking status: Never Smoker  . Smokeless tobacco: Never Used  Substance and Sexual Activity    . Alcohol use: Yes    Alcohol/week: 0.0 oz    Comment: rarely  . Drug use: No  . Sexual activity: Yes  Other Topics Concern  . Not on file  Social History Narrative   Single.   No children.   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    Enjoys working out, playing games, spending time with friends.    Past Surgical History:  Procedure Laterality Date  . ACHILLES TENDON LENGTHENING Bilateral 2009  . FOOT SURGERY  2009-2010  . LASIK  2016    Family History  Problem Relation Age of Onset  . Alcohol abuse Father   . Hyperlipidemia Father   . Cancer Maternal Grandmother        breast    No Known Allergies  Current Outpatient Medications on File Prior to Visit  Medication Sig Dispense Refill  . minocycline (MINOCIN,DYNACIN) 50 MG capsule Take 1 capsule (50 mg total) by mouth 2 (two) times daily. 180 capsule 1   No current facility-administered medications on file prior to visit.     BP 114/68   Pulse 66   Temp 97.8 F (36.6 C) (Oral)   Wt 180 lb 8 oz (81.9 kg)   BMI 25.17 kg/m    Objective:   Physical Exam  Constitutional: He appears well-nourished.  Neck: Neck supple.  Cardiovascular:  Normal rate and regular rhythm.  Pulmonary/Chest: Effort normal and breath sounds normal.  Skin: Skin is warm and dry.          Assessment & Plan:

## 2017-03-10 ENCOUNTER — Other Ambulatory Visit (INDEPENDENT_AMBULATORY_CARE_PROVIDER_SITE_OTHER): Payer: Managed Care, Other (non HMO)

## 2017-03-10 DIAGNOSIS — E349 Endocrine disorder, unspecified: Secondary | ICD-10-CM

## 2017-03-16 LAB — TESTOS,TOTAL,FREE AND SHBG (FEMALE)
Free Testosterone: 61.2 pg/mL (ref 35.0–155.0)
SEX HORMONE BINDING: 13 nmol/L (ref 10–50)
Testosterone, Total, LC-MS-MS: 303 ng/dL (ref 250–1100)

## 2017-03-16 LAB — ESTROGENS, TOTAL: Estrogen: 102.9 pg/mL (ref 60–190)

## 2017-05-30 ENCOUNTER — Encounter: Payer: Self-pay | Admitting: Primary Care

## 2017-05-30 ENCOUNTER — Ambulatory Visit: Payer: Managed Care, Other (non HMO) | Admitting: Primary Care

## 2017-05-30 DIAGNOSIS — L659 Nonscarring hair loss, unspecified: Secondary | ICD-10-CM | POA: Diagnosis not present

## 2017-05-30 DIAGNOSIS — R41840 Attention and concentration deficit: Secondary | ICD-10-CM

## 2017-05-30 DIAGNOSIS — G4700 Insomnia, unspecified: Secondary | ICD-10-CM

## 2017-05-30 NOTE — Assessment & Plan Note (Signed)
Present for past 3-6 months, more noticeable now. Family history of both sides of family. Will have him try OTC Rogaine first, then consider dermatology evaluation if no improvement.

## 2017-05-30 NOTE — Assessment & Plan Note (Signed)
Improved and denies problem now since exercising.

## 2017-05-30 NOTE — Assessment & Plan Note (Signed)
Diagnosed with ADD during 3rd or 4th grade, no medications since middle school. Will send for formal testing given that he's not had testing in years.   He will notify us once testing is complete. Referral placed to psychology.

## 2017-05-30 NOTE — Progress Notes (Signed)
Subjective:    Patient ID: Billey ChangDerrick M Brickner, male    DOB: 02/14/1996, 22 y.o.   MRN: [id number]  HPI  Mr. [name] is a 22 year old male who presents today with a chief complaint of difficulty concentrating. He also reports a receeding hairline with hair thinning.  1) Difficulty Concentrating: He endorses difficulty focusing, his mind "wanders" often, cannot finish tasks at work. His symptoms have been chronic for years, but increased symptoms since getting a promotion at work in January/February 2019. He is nervous that he won't succeed in his new role.  He was diagnosed with ADD in the 3rd-4th grade. He was once on Adderall or Vyvanse for which he last took in middle school. He did have a problem with eating very little when on medications so he came off. He did finish middle and high school okay without medications. He is interested in restarting treatment.  He denies insomnia, anxiety, depression, palpitations.   2) Hair Thinning: Gradual over the last 6 months. Family history of baldness, thinning hair in father and on mother and fathers side of family. He's not tried anything OTC. He denies palpitations, fatigue, unexplained weight loss or gain.     Review of Systems  Constitutional: Negative for fatigue.  Respiratory: Negative for shortness of breath and wheezing.   Cardiovascular: Negative for palpitations.  Skin:       Hair thinning  Neurological: Negative for dizziness and headaches.  Psychiatric/Behavioral: Positive for decreased concentration. Negative for sleep disturbance. The patient is not nervous/anxious.        Past Medical History:  Diagnosis Date  . Asthma   . Back muscle spasm   . Chronic right shoulder pain   . Cystic acne vulgaris   . Umbilical hernia      Social History   Socioeconomic History  . Marital status: Single    Spouse name: Not on file  . Number of children: Not on file  . Years of education: Not on file  . Highest education  level: Not on file  Occupational History  . Not on file  Social Needs  . Financial resource strain: Not on file  . Food insecurity:    Worry: Not on file    Inability: Not on file  . Transportation needs:    Medical: Not on file    Non-medical: Not on file  Tobacco Use  . Smoking status: Never Smoker  . Smokeless tobacco: Never Used  Substance and Sexual Activity  . Alcohol use: Yes    Alcohol/week: 0.0 oz    Comment: rarely  . Drug use: No  . Sexual activity: Yes  Lifestyle  . Physical activity:    Days per week: Not on file    Minutes per session: Not on file  . Stress: Not on file  Relationships  . Social connections:    Talks on phone: Not on file    Gets together: Not on file    Attends religious service: Not on file    Active member of club or organization: Not on file    Attends meetings of clubs or organizations: Not on file    Relationship status: Not on file  . Intimate partner violence:    Fear of current or ex partner: Not on file    Emotionally abused: Not on file    Physically abused: Not on file    Forced sexual activity: Not on file  Other Topics Concern  . Not on file  Social  History Narrative   Single.   No children.   Works as a Therapist, sports.    Enjoys working out, playing games, spending time with friends.    Past Surgical History:  Procedure Laterality Date  . ACHILLES TENDON LENGTHENING Bilateral 2009  . FOOT SURGERY  2009-2010  . LASIK  2016    Family History  Problem Relation Age of Onset  . Alcohol abuse Father   . Hyperlipidemia Father   . Cancer Maternal Grandmother        breast    No Known Allergies  Current Outpatient Medications on File Prior to Visit  Medication Sig Dispense Refill  . minocycline (MINOCIN,DYNACIN) 50 MG capsule Take 1 capsule (50 mg total) by mouth 2 (two) times daily. 180 capsule 1   No current facility-administered medications on file prior to visit.     BP 118/72   Pulse 96   Temp 98.1 F (36.7  C) (Oral)   Ht 5\' 11"  (1.803 m)   Wt 177 lb 4 oz (80.4 kg)   SpO2 99%   BMI 24.72 kg/m    Objective:   Physical Exam  Constitutional: He is oriented to person, place, and time. He appears well-nourished.  Neck: Neck supple.  Cardiovascular: Normal rate and regular rhythm.  Pulmonary/Chest: Effort normal and breath sounds normal. He has no wheezes. He has no rales.  Neurological: He is alert and oriented to person, place, and time.  Skin: Skin is warm and dry.  Very mild thinning to hair line with receeding hairline to frontal lobes bilaterally   Psychiatric: He has a normal mood and affect.          Assessment & Plan:

## 2017-05-30 NOTE — Patient Instructions (Signed)
You will be contacted regarding your referral for ADD testing.  Please let us know if you have not been contacted within one week.   Try Rogaine for hair thinning/balding.  Please message me once you've been tested for ADD/ADHD.  It was a pleasure to see you today!

## 2017-07-29 ENCOUNTER — Ambulatory Visit: Payer: 59 | Admitting: Psychology

## 2017-12-21 NOTE — Telephone Encounter (Signed)
Shirlee Limerick, do you know of any other options for ADD testing? See my chart message below.

## 2017-12-22 NOTE — Telephone Encounter (Signed)
Noted. Will await patient response.

## 2017-12-27 ENCOUNTER — Ambulatory Visit (INDEPENDENT_AMBULATORY_CARE_PROVIDER_SITE_OTHER): Payer: 59 | Admitting: Psychology

## 2017-12-27 DIAGNOSIS — F900 Attention-deficit hyperactivity disorder, predominantly inattentive type: Secondary | ICD-10-CM

## 2017-12-27 DIAGNOSIS — F4010 Social phobia, unspecified: Secondary | ICD-10-CM | POA: Diagnosis not present

## 2018-01-19 ENCOUNTER — Ambulatory Visit (INDEPENDENT_AMBULATORY_CARE_PROVIDER_SITE_OTHER): Payer: 59 | Admitting: Psychology

## 2018-01-19 DIAGNOSIS — F900 Attention-deficit hyperactivity disorder, predominantly inattentive type: Secondary | ICD-10-CM

## 2018-01-19 DIAGNOSIS — F4010 Social phobia, unspecified: Secondary | ICD-10-CM | POA: Diagnosis not present

## 2018-01-24 ENCOUNTER — Encounter: Payer: Self-pay | Admitting: Primary Care

## 2018-01-24 ENCOUNTER — Ambulatory Visit (INDEPENDENT_AMBULATORY_CARE_PROVIDER_SITE_OTHER): Payer: Managed Care, Other (non HMO) | Admitting: Primary Care

## 2018-01-24 VITALS — BP 130/76 | HR 84 | Temp 98.3°F | Ht 71.0 in | Wt 173.5 lb

## 2018-01-24 DIAGNOSIS — F900 Attention-deficit hyperactivity disorder, predominantly inattentive type: Secondary | ICD-10-CM | POA: Diagnosis not present

## 2018-01-24 MED ORDER — BUPROPION HCL ER (XL) 150 MG PO TB24: 150.0000 mg | ORAL_TABLET | Freq: Every day | ORAL | 1 refills | Status: DC

## 2018-01-24 NOTE — Progress Notes (Signed)
Subjective:    Patient ID: Charles ChangDerrick M Fischer, male    DOB: 10/30/1995, 22 y.o.   MRN: [id number]  HPI  [name] is a 22 year old male who presents today for follow up of ADHD testing.   He was evaluated in our office in early April 2019 with symptoms of difficulty focusing, mind wandering, cannot finish tasks. He endorsed a diagnosis of ADHD during childhood and was managed on stimulant medication from the 3rd-6th grade. Overall he'd been able to manage his symptoms without treatment up until a job promotion in early 2019. Given his symptoms without recent testing, he was sent for formal evaluation.  He was evaluated by Dr. Jason FilaBray on 12/27/17 and reported symptoms of difficulty paying attention to details, making careless mistakes accidentally at work, trouble staying focused when reading, engaging in conversation, home chores, tasks at work.  His report suggests that he does have clinically significant inattention and low hyperactivity-impulsivity.  His report does note that based off of his DSM-IV ADHD Symptoms Index score that his concentration difficulties are not necessarily attributable to ADHD.  He did meet criteria for social anxiety disorder.  He did also endorse participating in marijuana use daily after work.  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  His report also mentioned that compared to others with this diagnosis his condition appears relatively mild in severity.  He tested average on intelligence.  Today [name] endorsed daily marijuana use after work to help him relax and help with the "aches and pains" from working during the day.  He denies feeling depressed but does endorse some social anxiety.  He denies suicidal/homicidal thoughts.  Review of Systems  Respiratory: Negative for shortness of breath.   Cardiovascular: Negative for chest pain and palpitations.  Neurological: Negative for headaches.  Psychiatric/Behavioral:       See  HPI regarding ADHD.       Past Medical History:  Diagnosis Date  . Asthma   . Back muscle spasm   . Chronic right shoulder pain   . Cystic acne vulgaris   . Hypertension   . Umbilical hernia      Social History   Socioeconomic History  . Marital status: Single    Spouse name: Not on file  . Number of children: Not on file  . Years of education: Not on file  . Highest education level: Not on file  Occupational History  . Not on file  Social Needs  . Financial resource strain: Not on file  . Food insecurity:    Worry: Not on file    Inability: Not on file  . Transportation needs:    Medical: Not on file    Non-medical: Not on file  Tobacco Use  . Smoking status: Never Smoker  . Smokeless tobacco: Never Used  Substance and Sexual Activity  . Alcohol use: Yes    Alcohol/week: 0.0 standard drinks    Comment: rarely  . Drug use: No  . Sexual activity: Yes  Lifestyle  . Physical activity:    Days per week: Not on file    Minutes per session: Not on file  . Stress: Not on file  Relationships  . Social connections:    Talks on phone: Not on file    Gets together: Not on file    Attends religious service: Not on file    Active member of club or organization: Not on file    Attends meetings of clubs or organizations:  Not on file    Relationship status: Not on file  . Intimate partner violence:    Fear of current or ex partner: Not on file    Emotionally abused: Not on file    Physically abused: Not on file    Forced sexual activity: Not on file  Other Topics Concern  . Not on file  Social History Narrative   Single.   No children.   Works as a Therapist, sports.    Enjoys working out, playing games, spending time with friends.    Past Surgical History:  Procedure Laterality Date  . ACHILLES TENDON LENGTHENING Bilateral 2009  . FOOT SURGERY  2009-2010  . LASIK  2016    Family History  Problem Relation Age of Onset  . Alcohol abuse Father   . Hyperlipidemia  Father   . Cancer Maternal Grandmother        breast    No Known Allergies  Current Outpatient Medications on File Prior to Visit  Medication Sig Dispense Refill  . minocycline (MINOCIN,DYNACIN) 50 MG capsule Take 1 capsule (50 mg total) by mouth 2 (two) times daily. (Patient not taking: Reported on 01/24/2018) 180 capsule 1   No current facility-administered medications on file prior to visit.     BP 130/76   Pulse 84   Temp 98.3 F (36.8 C) (Oral)   Ht 5\' 11"  (1.803 m)   Wt 173 lb 8 oz (78.7 kg)   SpO2 99%   BMI 24.20 kg/m    Objective:   Physical Exam  Constitutional: He appears well-nourished.  Neck: Neck supple.  Cardiovascular: Normal rate and regular rhythm.  Respiratory: Effort normal and breath sounds normal.  Skin: Skin is warm and dry.  Psychiatric: He has a normal mood and affect.           Assessment & Plan:

## 2018-01-24 NOTE — Assessment & Plan Note (Addendum)
Patient underwent formal testing with Dr. Jason FilaBray on 12/27/2017, diagnosed with attention deficit/hyperactivity disorder, predominantly inattentive.  Overall his symptoms are mild based off the report, this coupled with his daily marijuana use, will avoid stimulant use for now.  Will trial Wellbutrin XL 150 mg every morning.  He will follow-up in 6 weeks for reevaluation of symptoms.  Discussed potential side effects, he verbalized understanding.  See full report from ADHD testing, scanned into chart.

## 2018-01-24 NOTE — Patient Instructions (Signed)
Start bupropion ER 150 mg once daily in the morning for concentration and anxiety.   Please schedule a follow up visit with me in 6 weeks for re-evaluation.   It was a pleasure to see you today!

## 2018-01-31 ENCOUNTER — Ambulatory Visit: Payer: 59 | Admitting: Psychology

## 2018-03-16 ENCOUNTER — Other Ambulatory Visit: Payer: Self-pay | Admitting: Primary Care

## 2018-03-16 DIAGNOSIS — F900 Attention-deficit hyperactivity disorder, predominantly inattentive type: Secondary | ICD-10-CM

## 2018-04-14 NOTE — Telephone Encounter (Signed)
Charles Fischer, can you take a look?

## 2018-04-15 ENCOUNTER — Encounter: Payer: Self-pay | Admitting: Emergency Medicine

## 2018-04-15 ENCOUNTER — Emergency Department
Admission: EM | Admit: 2018-04-15 | Discharge: 2018-04-15 | Disposition: A | Discharge status: Home / Self Care | Payer: Managed Care, Other (non HMO) | Attending: Emergency Medicine | Admitting: Emergency Medicine

## 2018-04-15 ENCOUNTER — Emergency Department: Payer: Managed Care, Other (non HMO)

## 2018-04-15 ENCOUNTER — Other Ambulatory Visit: Payer: Self-pay

## 2018-04-15 DIAGNOSIS — R05 Cough: Secondary | ICD-10-CM | POA: Diagnosis present

## 2018-04-15 DIAGNOSIS — J209 Acute bronchitis, unspecified: Secondary | ICD-10-CM

## 2018-04-15 DIAGNOSIS — Z79899 Other long term (current) drug therapy: Secondary | ICD-10-CM | POA: Diagnosis not present

## 2018-04-15 DIAGNOSIS — I10 Essential (primary) hypertension: Secondary | ICD-10-CM | POA: Diagnosis not present

## 2018-04-15 MED ORDER — FLUTICASONE PROPIONATE 50 MCG/ACT NA SUSP: 2.0000 | Freq: Every day | NASAL | 0 refills | Status: DC

## 2018-04-15 MED ORDER — IPRATROPIUM-ALBUTEROL 0.5-2.5 (3) MG/3ML IN SOLN
3.0000 mL | Freq: Once | RESPIRATORY_TRACT | Status: AC
Start: 2018-04-15 — End: 2018-04-15
  Administered 2018-04-15: 3 mL via RESPIRATORY_TRACT
  Filled 2018-04-15: qty 3

## 2018-04-15 MED ORDER — PREDNISONE 20 MG PO TABS: 20.0000 mg | ORAL_TABLET | Freq: Two times a day (BID) | ORAL | 0 refills | Status: AC

## 2018-04-15 MED ORDER — AZITHROMYCIN 250 MG PO TABS: ORAL_TABLET | ORAL | 0 refills | Status: DC

## 2018-04-15 MED ORDER — BENZONATATE 150 MG PO CAPS: 150.0000 mg | ORAL_CAPSULE | Freq: Three times a day (TID) | ORAL | 0 refills | Status: AC | PRN

## 2018-04-15 MED ORDER — ALBUTEROL SULFATE 108 (90 BASE) MCG/ACT IN AEPB: 2.0000 | INHALATION_SPRAY | Freq: Four times a day (QID) | RESPIRATORY_TRACT | 0 refills | Status: DC | PRN

## 2018-04-15 NOTE — Discharge Instructions (Signed)
Your exam and CXR shows bronchitis. Take the prescription meds as directed. Drink plenty of fluids to prevent dehydration. Follow-up with your provider for ongoing symptoms.

## 2018-04-15 NOTE — ED Triage Notes (Signed)
Cough x 1 week. Sweats and fevers. Pain with inspiration.

## 2018-04-15 NOTE — ED Provider Notes (Signed)
[facility] Emergency Department Provider Note ____________________________________________  Time seen: 1022  I have reviewed the triage vital signs and the nursing notes.  HISTORY  Chief Complaint  Cough  HPI Charles Fischer is a 23 y.o. male presents himself to the ED for evaluation of 1 week complaint of cough, congestion, shortness of breath.  Patient is also noted some sweats and subjective fevers.  He describes increased pain with inspiration.  He denies any productive cough, wheezing, or chest pain.  Past Medical History:  Diagnosis Date  . Asthma   . Back muscle spasm   . Chronic right shoulder pain   . Cystic acne vulgaris   . Hypertension   . Umbilical hernia     Patient Active Problem List   Diagnosis Date Noted  . Attention deficit hyperactivity disorder (ADHD), predominantly inattentive type 01/24/2018  . Difficulty concentrating 05/30/2017  . Balding 05/30/2017  . Insomnia 11/22/2016  . Testosterone deficiency 04/02/2016  . Annual physical exam 02/28/2015  . Acne cystica 10/03/2014  . Asthma 10/03/2014  . Allergic rhinitis 12/19/2013    Past Surgical History:  Procedure Laterality Date  . ACHILLES TENDON LENGTHENING Bilateral 2009  . FOOT SURGERY  2009-2010  . LASIK  2016    Prior to Admission medications   Medication Sig Start Date End Date Taking? Authorizing Provider  Albuterol Sulfate (PROAIR RESPICLICK) 108 (90 Base) MCG/ACT AEPB Inhale 2 puffs into the lungs 4 (four) times daily as needed for up to 30 days. 04/15/18 05/15/18  Milda Lindvall, Charlesetta Ivory, PA-C  azithromycin (ZITHROMAX Z-PAK) 250 MG tablet Take 2 tablets (500 mg) on  Day 1,  followed by 1 tablet (250 mg) once daily on Days 2 through 5. 04/15/18   Dewey Viens, Charlesetta Ivory, PA-C  Benzonatate 150 MG CAPS Take 1 capsule (150 mg total) by mouth 3 (three) times daily as needed for up to 10 days. 04/15/18 04/25/18  Saliyah Gillin, Charlesetta Ivory, PA-C  buPROPion (WELLBUTRIN XL)  150 MG 24 hr tablet Take 1 tablet (150 mg total) by mouth daily. 03/16/18   Doreene Nest, NP  fluticasone (FLONASE) 50 MCG/ACT nasal spray Place 2 sprays into both nostrils daily. 04/15/18   Embree Brawley, Charlesetta Ivory, PA-C  predniSONE (DELTASONE) 20 MG tablet Take 1 tablet (20 mg total) by mouth 2 (two) times daily with a meal for 5 days. 04/15/18 04/20/18  Elias Dennington, Charlesetta Ivory, PA-C   Allergies Patient has no known allergies.  Family History  Problem Relation Age of Onset  . Alcohol abuse Father   . Hyperlipidemia Father   . Cancer Maternal Grandmother        breast    Social History Social History   Tobacco Use  . Smoking status: Never Smoker  . Smokeless tobacco: Never Used  Substance Use Topics  . Alcohol use: Yes    Alcohol/week: 0.0 standard drinks    Comment: rarely  . Drug use: No    Review of Systems  Constitutional: Positive for fever. Eyes: Negative for visual changes. ENT: Negative for sore throat. Cardiovascular: Negative for chest pain. Respiratory: Positive for shortness of breath.  Reports cough and pain with aspiration. Gastrointestinal: Negative for abdominal pain, vomiting and diarrhea. Genitourinary: Negative for dysuria. Musculoskeletal: Negative for back pain. Skin: Negative for rash. Neurological: Negative for headaches, focal weakness or numbness. ____________________________________________  PHYSICAL EXAM:  VITAL SIGNS: ED Triage Vitals  Enc Vitals Group     BP 04/15/18 1005 (!) 155/86  Pulse Rate 04/15/18 1005 (!) 112     Resp 04/15/18 1005 20     Temp 04/15/18 1005 99.5 F (37.5 C)     Temp Source 04/15/18 1005 Oral     SpO2 04/15/18 1005 96 %     Weight 04/15/18 1006 170 lb (77.1 kg)     Height 04/15/18 1006 5\' 11"  (1.803 m)     Head Circumference --      Peak Flow --      Pain Score 04/15/18 1005 7     Pain Loc --      Pain Edu? --      Excl. in GC? --     Constitutional: Alert and oriented. Well appearing and in no  distress. Head: Normocephalic and atraumatic. Eyes: Conjunctivae are normal. Normal extraocular movements Ears: Canals clear. TMs intact bilaterally. Nose: No congestion/rhinorrhea/epistaxis. Mouth/Throat: Mucous membranes are moist. Cardiovascular: Normal rate, regular rhythm. Normal distal pulses. Respiratory: Normal respiratory effort. No wheezes/rales/rhonchi.  Intermittent cough during exam. Musculoskeletal: Nontender with normal range of motion in all extremities.  Neurologic:  Normal gait without ataxia. Normal speech and language. No gross focal neurologic deficits are appreciated. Skin:  Skin is warm, dry and intact. No rash noted. ____________________________________________   RADIOLOGY  CXR IMPRESSION: Diffuse bronchial thickening consistent with bronchitis. ____________________________________________  PROCEDURES  Procedures DuoNeb x1 ____________________________________________  INITIAL IMPRESSION / ASSESSMENT AND PLAN / ED COURSE  Patient with ED evaluation of a one-week complaint of persistent cough, fevers, chills.  His clinical picture is concerning for a viral etiology including influenza, bronchitis, pneumonia.  His chest x-ray is reassuring at that shows bronchial thickening consistent with bronchitis.  Patient reports improvement after DuoNeb treatment in the ED.  He will be treated with albuterol MDI, Tessalon Perles, Flonase, prednisone, and a prescription for azithromycin.  He is encouraged to follow-up with primary provider or return to the ED as needed. ____________________________________________  FINAL CLINICAL IMPRESSION(S) / ED DIAGNOSES  Final diagnoses:  Acute bronchitis, unspecified organism      Lissa Hoard, PA-C 04/15/18 1137    Myrna Blazer, MD 04/15/18 1531

## 2018-04-15 NOTE — ED Notes (Signed)
Pt presents with a cough with sweats and chills. Pt is NAD and awaiting EDP and x-ray

## 2018-04-17 ENCOUNTER — Encounter: Payer: Self-pay | Admitting: Primary Care

## 2018-04-17 ENCOUNTER — Ambulatory Visit (INDEPENDENT_AMBULATORY_CARE_PROVIDER_SITE_OTHER): Payer: Managed Care, Other (non HMO) | Admitting: Primary Care

## 2018-04-17 DIAGNOSIS — F900 Attention-deficit hyperactivity disorder, predominantly inattentive type: Secondary | ICD-10-CM

## 2018-04-17 MED ORDER — BUPROPION HCL ER (XL) 150 MG PO TB24: 150.0000 mg | ORAL_TABLET | Freq: Every day | ORAL | 1 refills | Status: DC

## 2018-04-17 NOTE — Assessment & Plan Note (Signed)
No improvement in focus but improved mood and doing better at work. He would like to stay on Wellbutrin and I agree as this seems to be beneficial for his overall mood. Other medications for ADHD treatment including stimulants will interact with Wellbutrin. He will call ADHD treatment centers for further treatment.

## 2018-04-17 NOTE — Patient Instructions (Signed)
Continue bupropion ER 150 mg for focus and mood.  Call the ADHD treatment centers listed on the packet from Dr. Jason Fila.   Make sure to drink plenty of fluids and rest.   It was a pleasure to see you today!

## 2018-04-17 NOTE — Progress Notes (Signed)
Subjective:    Patient ID: Charles Fischer, male    DOB: [birth year], 23 y.o.   MRN: [id number]  HPI  Mr. Staples is a 23 year old male who presents today for follow up of ADHD.  He was last evaluated in our office in late November 2019 for ADHD follow up. He underwent formal ADHD testing with Dr. Jason Fila in late October 2019, diagnosed with ADHD predominantly inattentive. His symptoms were mild overall, he also endorsed use of marijuana use. He was initiated on Wellbutrin XL 150 mg daily and asked to follow up.  Since his last visit he's noticed improvement in mood, he is more comfortable talking with people at work, improvement in attitude. He hasn't noticed an increase in focus, tends to still wander. He denies palpitations, chest pain, difficulty with sleep, SI/HI.  Of note he was evaluated in the [facility] ED on 04/15/18 for a chief complaint of flu like symptoms. He was diagnosed with bronchitis and influenza, treated with azithromycin, prednisone, albuterol inhaler. Overall he's noticed some improvement but continues to experience chills, body aches, fatigue, sweats. He is needing a work note for excuse for February 14th, 17th, 18th, 19th. He is compliant to his antibiotics and prednisone.   Review of Systems  Constitutional: Positive for fatigue and fever.  HENT: Positive for congestion.   Respiratory: Positive for cough. Negative for shortness of breath.   Cardiovascular: Negative for chest pain.  Psychiatric/Behavioral: The patient is not nervous/anxious.        Mood improved, no improvement in focus       Past Medical History:  Diagnosis Date  . Asthma   . Back muscle spasm   . Chronic right shoulder pain   . Cystic acne vulgaris   . Hypertension   . Umbilical hernia      Social History   Socioeconomic History  . Marital status: Single    Spouse name: Not on file  . Number of children: Not on file  . Years of education: Not on file  . Highest education level: Not on  file  Occupational History  . Not on file  Social Needs  . Financial resource strain: Not on file  . Food insecurity:    Worry: Not on file    Inability: Not on file  . Transportation needs:    Medical: Not on file    Non-medical: Not on file  Tobacco Use  . Smoking status: Never Smoker  . Smokeless tobacco: Never Used  Substance and Sexual Activity  . Alcohol use: Yes    Alcohol/week: 0.0 standard drinks    Comment: rarely  . Drug use: No  . Sexual activity: Yes  Lifestyle  . Physical activity:    Days per week: Not on file    Minutes per session: Not on file  . Stress: Not on file  Relationships  . Social connections:    Talks on phone: Not on file    Gets together: Not on file    Attends religious service: Not on file    Active member of club or organization: Not on file    Attends meetings of clubs or organizations: Not on file    Relationship status: Not on file  . Intimate partner violence:    Fear of current or ex partner: Not on file    Emotionally abused: Not on file    Physically abused: Not on file    Forced sexual activity: Not on file  Other Topics Concern  .  Not on file  Social History Narrative   Single.   No children.   Works as a Therapist, sports.    Enjoys working out, playing games, spending time with friends.    Past Surgical History:  Procedure Laterality Date  . ACHILLES TENDON LENGTHENING Bilateral 2009  . FOOT SURGERY  2009-2010  . LASIK  2016    Family History  Problem Relation Age of Onset  . Alcohol abuse Father   . Hyperlipidemia Father   . Cancer Maternal Grandmother        breast    No Known Allergies  Current Outpatient Medications on File Prior to Visit  Medication Sig Dispense Refill  . Albuterol Sulfate (PROAIR RESPICLICK) 108 (90 Base) MCG/ACT AEPB Inhale 2 puffs into the lungs 4 (four) times daily as needed for up to 30 days. 1 each 0  . azithromycin (ZITHROMAX Z-PAK) 250 MG tablet Take 2 tablets (500 mg) on  Day 1,   followed by 1 tablet (250 mg) once daily on Days 2 through 5. 6 each 0  . Benzonatate 150 MG CAPS Take 1 capsule (150 mg total) by mouth 3 (three) times daily as needed for up to 10 days. 30 capsule 0  . fluticasone (FLONASE) 50 MCG/ACT nasal spray Place 2 sprays into both nostrils daily. 16 g 0  . predniSONE (DELTASONE) 20 MG tablet Take 1 tablet (20 mg total) by mouth 2 (two) times daily with a meal for 5 days. 10 tablet 0   No current facility-administered medications on file prior to visit.     BP 130/82   Pulse 90   Temp 98.7 F (37.1 C) (Oral)   Ht 5\' 11"  (1.803 m)   Wt 176 lb 8 oz (80.1 kg)   SpO2 99%   BMI 24.62 kg/m    Objective:   Physical Exam  Constitutional: He appears well-nourished. He does not appear ill.  HENT:  Right Ear: Tympanic membrane and ear canal normal.  Left Ear: Tympanic membrane and ear canal normal.  Nose: No mucosal edema. Right sinus exhibits no maxillary sinus tenderness and no frontal sinus tenderness. Left sinus exhibits no maxillary sinus tenderness and no frontal sinus tenderness.  Mouth/Throat: Oropharynx is clear and moist.  Neck: Neck supple.  Cardiovascular: Regular rhythm.  Respiratory: Effort normal and breath sounds normal. He has no wheezes.  Dry cough during exam  Skin: Skin is warm and dry.  Psychiatric: He has a normal mood and affect.           Assessment & Plan:  Acute Bronchitis/Flu:  Evaluated at [facility] ED last week, diagnosed with bronchitis and flu. Today he appears ill but stable. Continue prescribed regimen. Discussed to hydrate. Work note provided. Notes and imaging reviewed from ED stay.  Doreene Nest, NP

## 2018-06-09 IMAGING — DX DG ANKLE COMPLETE 3+V*L*
3 series · 3 of 3 positions shown · non-contrast
Comparison: None.

CLINICAL DATA: Left ankle pain, bruising, swelling

EXAM:
LEFT ANKLE COMPLETE - 3+ VIEW

[ankle ap]
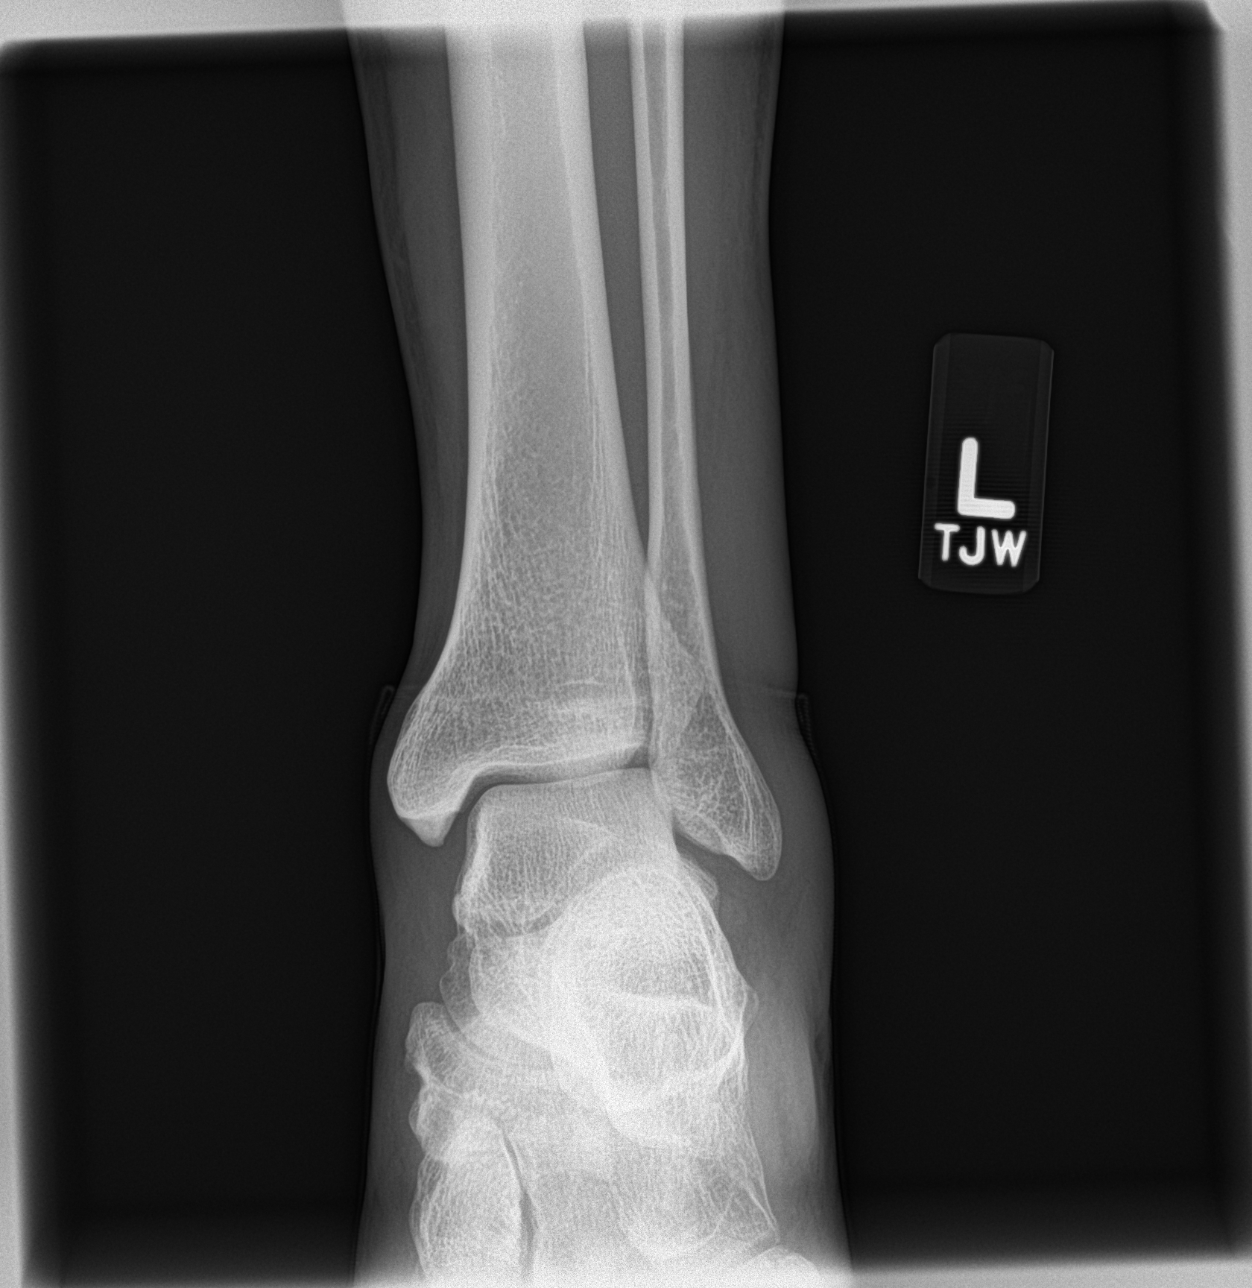

[ankle obl]
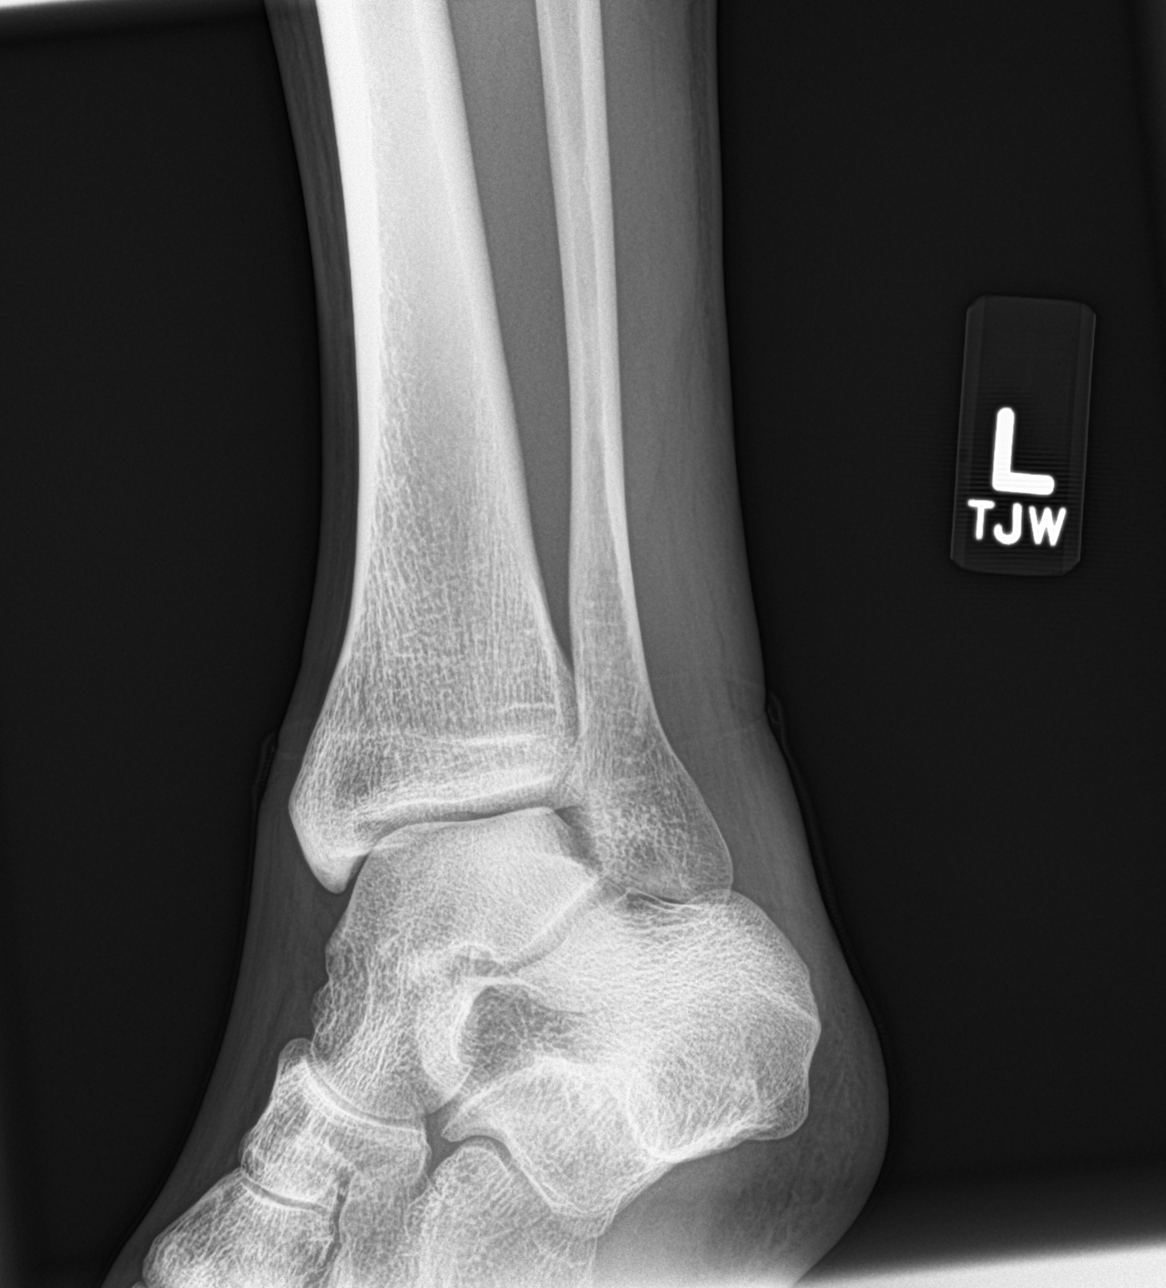

[ankle lat]
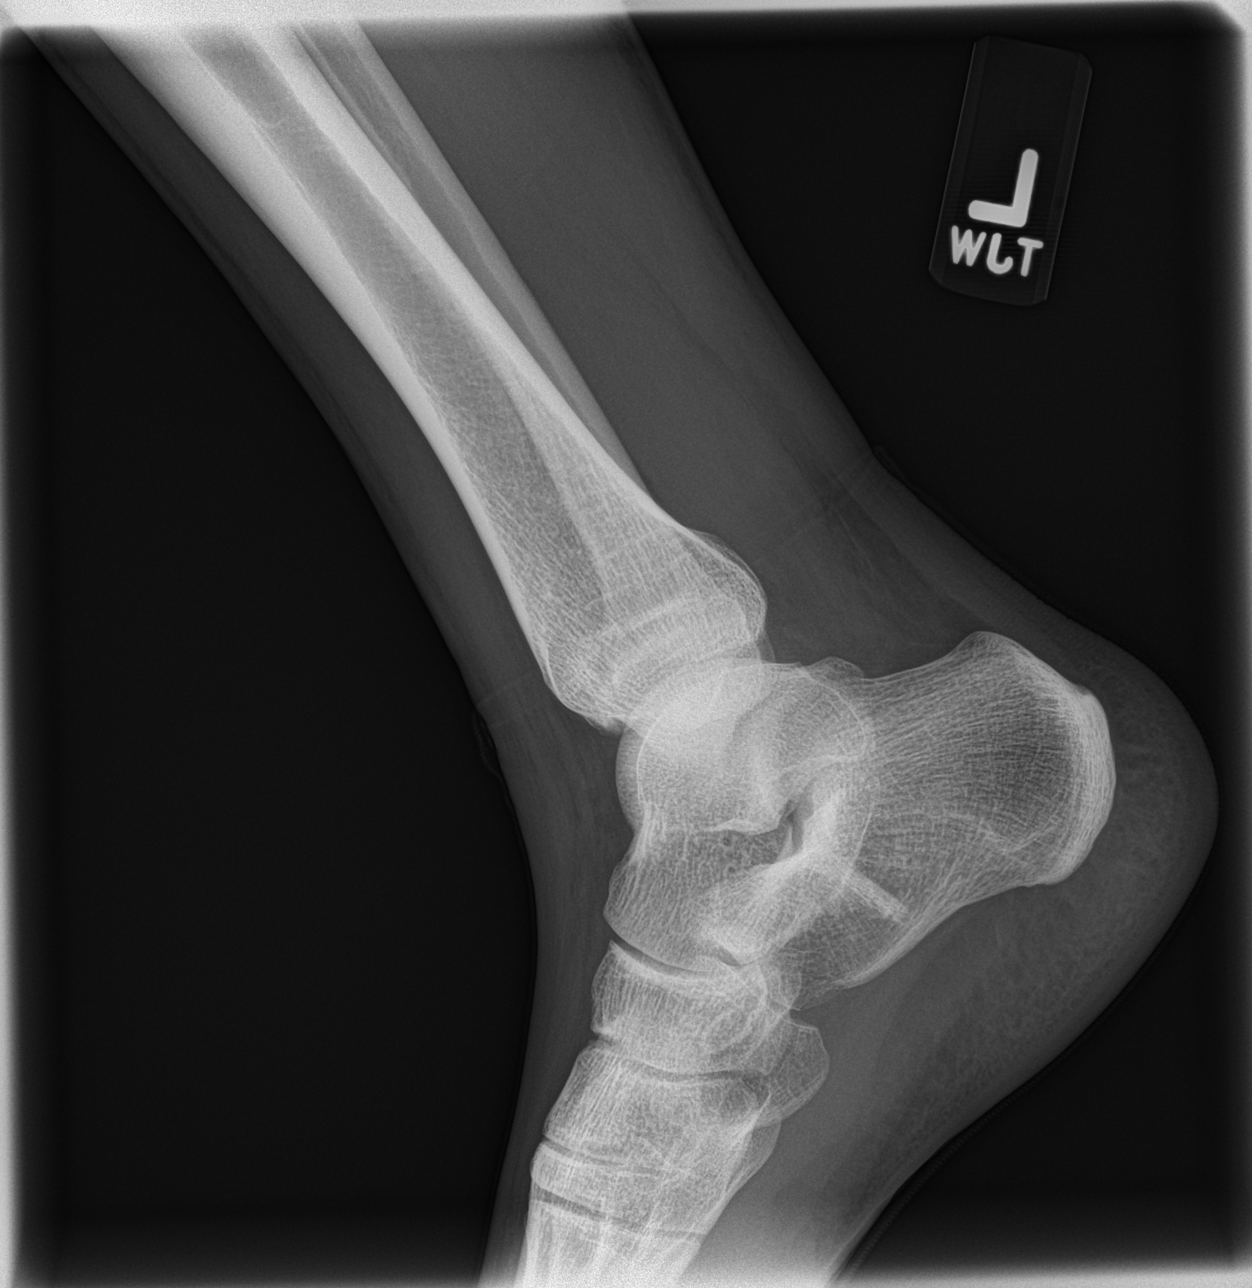

[3 of 3 positions shown; findings below may reference images not displayed]

FINDINGS: There is no evidence of fracture, dislocation, or joint effusion.
There is no evidence of arthropathy or other focal bone abnormality.
Soft tissues are unremarkable.
IMPRESSION: Negative.

## 2018-11-14 ENCOUNTER — Other Ambulatory Visit: Payer: Self-pay | Admitting: Primary Care

## 2018-11-14 DIAGNOSIS — F900 Attention-deficit hyperactivity disorder, predominantly inattentive type: Secondary | ICD-10-CM

## 2019-02-09 NOTE — Telephone Encounter (Signed)
Did someone call him regarding Covid screening?

## 2019-02-09 NOTE — Telephone Encounter (Signed)
Im not sure what this is in regards to?

## 2019-02-13 ENCOUNTER — Ambulatory Visit: Payer: Managed Care, Other (non HMO) | Admitting: Primary Care

## 2019-02-15 ENCOUNTER — Encounter: Payer: Self-pay | Admitting: Primary Care

## 2019-02-15 ENCOUNTER — Ambulatory Visit (INDEPENDENT_AMBULATORY_CARE_PROVIDER_SITE_OTHER): Payer: Managed Care, Other (non HMO) | Admitting: Primary Care

## 2019-02-15 ENCOUNTER — Other Ambulatory Visit: Payer: Self-pay

## 2019-02-15 VITALS — BP 120/84 | HR 102 | Temp 97.5°F | Ht 71.0 in | Wt 169.8 lb

## 2019-02-15 DIAGNOSIS — Z113 Encounter for screening for infections with a predominantly sexual mode of transmission: Secondary | ICD-10-CM

## 2019-02-15 DIAGNOSIS — F900 Attention-deficit hyperactivity disorder, predominantly inattentive type: Secondary | ICD-10-CM

## 2019-02-15 NOTE — Patient Instructions (Signed)
Stop by the lab prior to leaving today. I will notify you of your results once received.   Schedule a lab visit to return for the urine portion of your labs.  It was a pleasure to see you today!

## 2019-02-15 NOTE — Progress Notes (Signed)
Subjective:    Patient ID: Charles Fischer, male    DOB: 06/29/1995, 23 y.o.   MRN: [id number]  HPI  This visit occurred during the SARS-CoV-2 public health emergency.  Safety protocols were in place, including screening questions prior to the visit, additional usage of staff PPE, and extensive cleaning of exam room while observing appropriate contact time as indicated for disinfecting solutions.   Charles Fischer is a 23 year old male who presents today for follow up and STD testing.  1) ADHD: Currently managed on bupropion XL 150 mg. Overall doing well and would like to continue.   2) STD testing: He is sexually active with one partner for whom he's been dating for the last two weeks, no protection. He denies penile pain/discharge, dysuria, pelvic pain/pressure.  BP Readings from Last 3 Encounters:  02/15/19 120/84  04/17/18 130/82  04/15/18 (!) 155/86     Review of Systems  Constitutional: Negative for fever.  Genitourinary: Negative for discharge, dysuria, frequency, hematuria, penile pain, scrotal swelling and testicular pain.  Psychiatric/Behavioral: Negative for suicidal ideas. The patient is not nervous/anxious.        Past Medical History:  Diagnosis Date  . Asthma   . Back muscle spasm   . Chronic right shoulder pain   . Cystic acne vulgaris   . Hypertension   . Umbilical hernia      Social History   Socioeconomic History  . Marital status: Single    Spouse name: Not on file  . Number of children: Not on file  . Years of education: Not on file  . Highest education level: Not on file  Occupational History  . Not on file  Tobacco Use  . Smoking status: Never Smoker  . Smokeless tobacco: Never Used  Substance and Sexual Activity  . Alcohol use: Yes    Alcohol/week: 0.0 standard drinks    Comment: rarely  . Drug use: No  . Sexual activity: Yes  Other Topics Concern  . Not on file  Social History Narrative   Single.   No children.   Works as a  Youth worker.    Enjoys working out, playing games, spending time with friends.   Social Determinants of Health   Financial Resource Strain:   . Difficulty of Paying Living Expenses: Not on file  Food Insecurity:   . Worried About Charity fundraiser in the Last Year: Not on file  . Ran Out of Food in the Last Year: Not on file  Transportation Needs:   . Lack of Transportation (Medical): Not on file  . Lack of Transportation (Non-Medical): Not on file  Physical Activity:   . Days of Exercise per Week: Not on file  . Minutes of Exercise per Session: Not on file  Stress:   . Feeling of Stress : Not on file  Social Connections:   . Frequency of Communication with Friends and Family: Not on file  . Frequency of Social Gatherings with Friends and Family: Not on file  . Attends Religious Services: Not on file  . Active Member of Clubs or Organizations: Not on file  . Attends Archivist Meetings: Not on file  . Marital Status: Not on file  Intimate Partner Violence:   . Fear of Current or Ex-Partner: Not on file  . Emotionally Abused: Not on file  . Physically Abused: Not on file  . Sexually Abused: Not on file    Past Surgical History:  Procedure Laterality Date  .  ACHILLES TENDON LENGTHENING Bilateral 2009  . FOOT SURGERY  2009-2010  . LASIK  2016    Family History  Problem Relation Age of Onset  . Alcohol abuse Father   . Hyperlipidemia Father   . Cancer Maternal Grandmother        breast    No Known Allergies  Current Outpatient Medications on File Prior to Visit  Medication Sig Dispense Refill  . buPROPion (WELLBUTRIN XL) 150 MG 24 hr tablet TAKE 1 TABLET (150 MG TOTAL) BY MOUTH DAILY. FOR MOOD AND FOCUS. 90 tablet 1  . fluticasone (FLONASE) 50 MCG/ACT nasal spray Place 2 sprays into both nostrils daily. 16 g 0   No current facility-administered medications on file prior to visit.    BP 120/84   Pulse (!) 102   Temp (!) 97.5 F (36.4 C) (Temporal)    Ht 5\' 11"  (1.803 m)   Wt 169 lb 12 oz (77 kg)   SpO2 98%   BMI 23.68 kg/m    Objective:   Physical Exam  Constitutional: He appears well-nourished.  Cardiovascular: Normal rate and regular rhythm.  Respiratory: Effort normal and breath sounds normal.  Musculoskeletal:     Cervical back: Neck supple.  Skin: Skin is warm and dry.  Psychiatric: He has a normal mood and affect.           Assessment & Plan:

## 2019-02-15 NOTE — Assessment & Plan Note (Signed)
Doing well on bupropion for overall mood. Continue same.

## 2019-02-20 ENCOUNTER — Other Ambulatory Visit (INDEPENDENT_AMBULATORY_CARE_PROVIDER_SITE_OTHER): Payer: Managed Care, Other (non HMO)

## 2019-02-20 DIAGNOSIS — Z113 Encounter for screening for infections with a predominantly sexual mode of transmission: Secondary | ICD-10-CM

## 2019-02-20 LAB — HEPATITIS C ANTIBODY
Hepatitis C Ab: NONREACTIVE
SIGNAL TO CUT-OFF: 0.05 (ref <1.00)

## 2019-02-20 LAB — HIV ANTIBODY (ROUTINE TESTING W REFLEX): HIV 1&2 Ab, 4th Generation: NONREACTIVE

## 2019-02-20 LAB — RPR: RPR Ser Ql: NONREACTIVE

## 2019-02-20 LAB — HSV(HERPES SIMPLEX VRS) I + II AB-IGG
HAV 1 IGG,TYPE SPECIFIC AB: <0.9 index
HSV 2 IGG,TYPE SPECIFIC AB: <0.9 index

## 2019-02-20 LAB — HSV 1/2 AB (IGM), IFA W/RFLX TITER
HSV 1 IgM Screen: NEGATIVE
HSV 2 IgM Screen: NEGATIVE

## 2019-02-21 LAB — C. TRACHOMATIS/N. GONORRHOEAE RNA
C. trachomatis RNA, TMA: NOT DETECTED
N. gonorrhoeae RNA, TMA: NOT DETECTED

## 2019-02-21 LAB — TRICHOMONAS VAGINALIS RNA, QL,MALES: Trichomonas vaginalis RNA: NOT DETECTED

## 2019-11-28 IMAGING — CR DG CHEST 2V
1 series · 2 of 2 positions shown · non-contrast
Comparison: None.

CLINICAL DATA: Cough, shortness of breath, headache and subjective
fever.

EXAM:
CHEST - 2 VIEW

[Series 1: dg chest 2 view · 0.14mm/px · 2 of 2 slices shown]
[im 1/2]
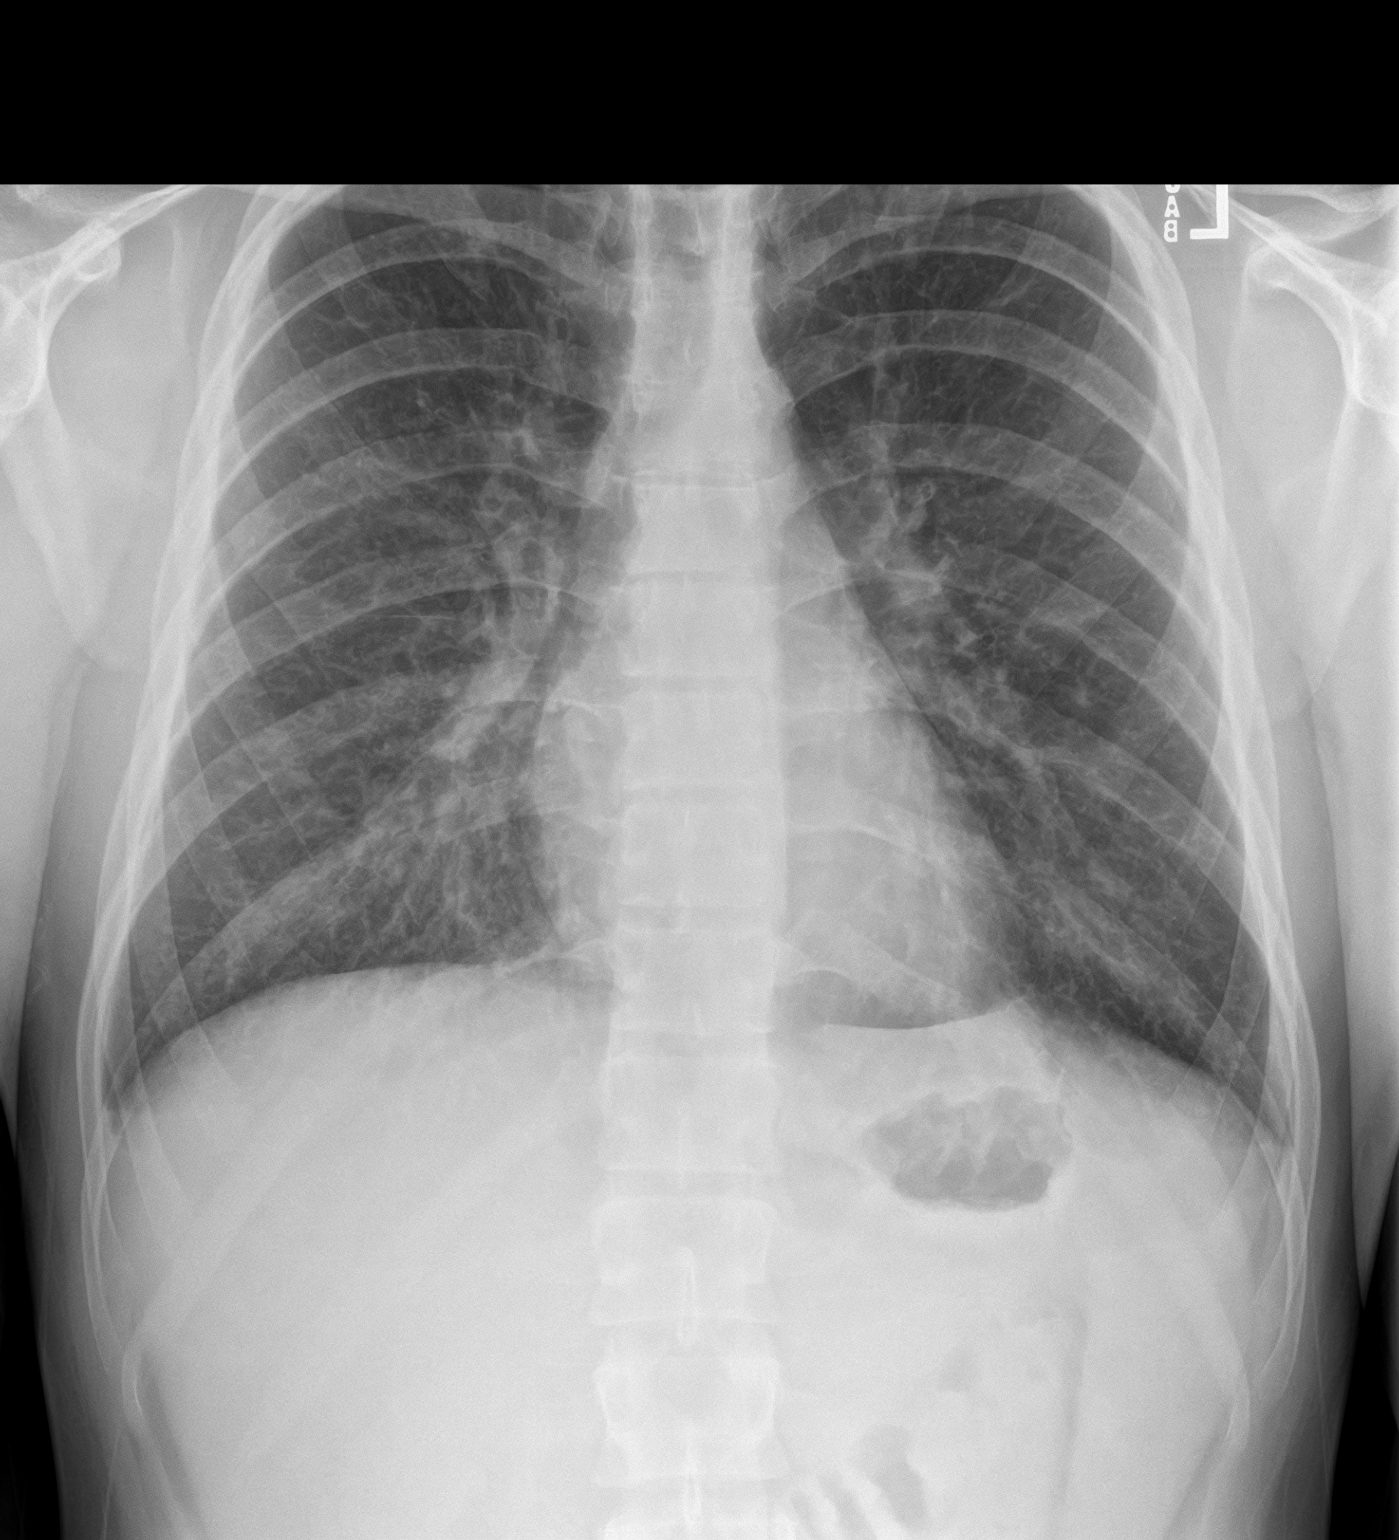
[im 2/2]
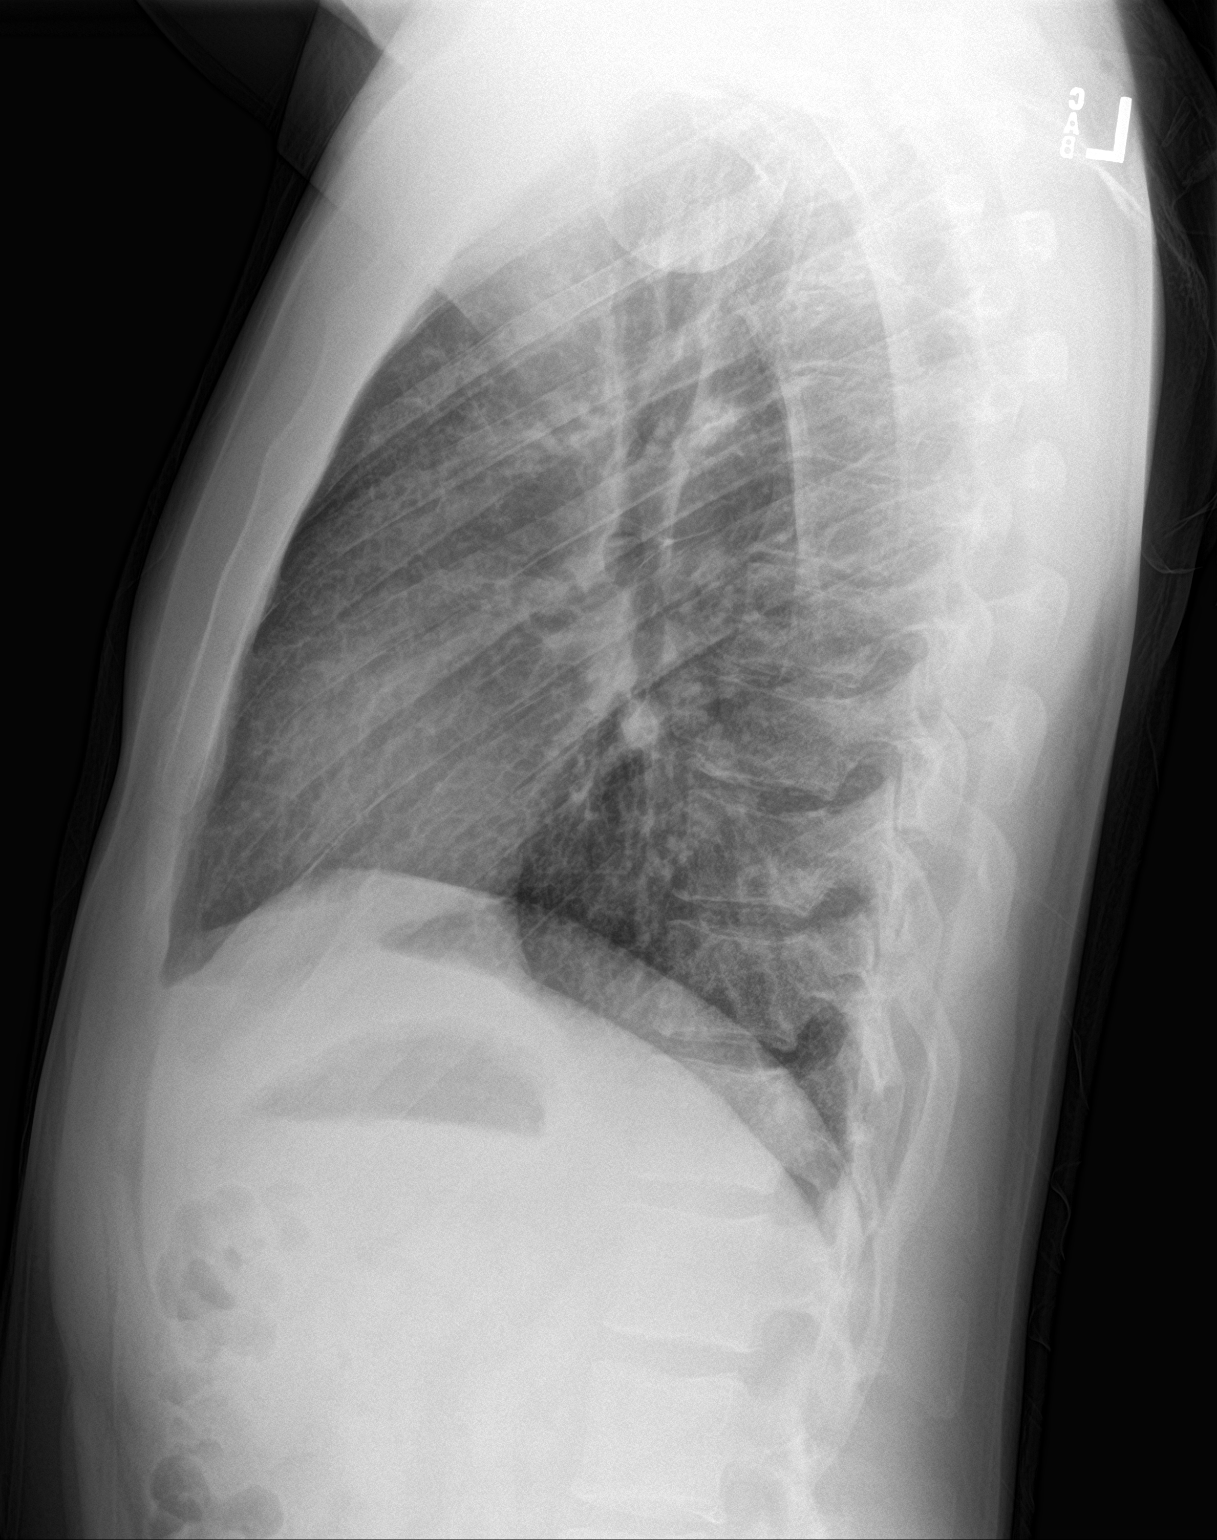

[2 of 2 positions shown; findings below may reference images not displayed]

FINDINGS: The heart size and mediastinal contours are within normal limits.
Lung volumes are normal. There is diffuse bronchial thickening
throughout both lungs. Findings are consistent with acute
bronchitis. There is no evidence of pulmonary edema, focal airspace
consolidation, pneumothorax, nodule or pleural fluid. The visualized
skeletal structures are unremarkable.
IMPRESSION: Diffuse bronchial thickening consistent with bronchitis.

## 2022-03-30 DIAGNOSIS — M9906 Segmental and somatic dysfunction of lower extremity: Secondary | ICD-10-CM | POA: Diagnosis not present

## 2022-03-30 DIAGNOSIS — M9902 Segmental and somatic dysfunction of thoracic region: Secondary | ICD-10-CM | POA: Diagnosis not present

## 2022-03-30 DIAGNOSIS — M9901 Segmental and somatic dysfunction of cervical region: Secondary | ICD-10-CM | POA: Diagnosis not present

## 2022-03-30 DIAGNOSIS — M9903 Segmental and somatic dysfunction of lumbar region: Secondary | ICD-10-CM | POA: Diagnosis not present

## 2023-06-09 DIAGNOSIS — F411 Generalized anxiety disorder: Secondary | ICD-10-CM | POA: Diagnosis not present

## 2023-06-15 ENCOUNTER — Encounter: Payer: Self-pay | Admitting: Behavioral Health

## 2023-06-15 ENCOUNTER — Ambulatory Visit (INDEPENDENT_AMBULATORY_CARE_PROVIDER_SITE_OTHER): Payer: Self-pay | Admitting: Behavioral Health

## 2023-06-15 VITALS — BP 143/96 | HR 108 | Ht 72.0 in | Wt 175.0 lb

## 2023-06-15 DIAGNOSIS — F900 Attention-deficit hyperactivity disorder, predominantly inattentive type: Secondary | ICD-10-CM

## 2023-06-15 MED ORDER — AMPHETAMINE-DEXTROAMPHET ER 10 MG PO CP24: 10.0000 mg | ORAL_CAPSULE | Freq: Every day | ORAL | 0 refills | Status: DC

## 2023-06-15 NOTE — Progress Notes (Signed)
 [facility] MD/PA/NP Initial Note  06/15/2023 3:44 PM Charles Fischer  MRN:  [id number]  Chief Complaint:  Chief Complaint   ADHD; Establish Care; Patient Education     HPI:  "Charles Fischer", 28 year old male presents to this office for initial visit and to establish care.  Collateral information should be considered reliable.  Patient is calm and articulate.  Patient states that he was diagnosed with ADHD in third grade.  He was taking medication until he reached high school.  Said that he did not like the way medication was affecting his appetite and weight so he stopped.  Says that he approached his PCP years later and she requested that he be retested.  He reports that he was retested at [facility] and it again was positive for ADHD inattentive type.  Says PCP prescribed Wellbutrin but it was ineffective.  Said that he decided to just remain off the medication until he started to have problems with inattention and focus on his job.  He is requesting low-dose stimulant to assist him while he is working.  He says, "my mind just wonders all over the place and no matter how hard I try my mind strays everywhere".  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  He denies any anxiety or depression.  Reports that sleep is good 7 to 8 hours per night.  Patient is working 60 hours/week.  He has been married now for 1 year and going to plan family in the near future.  His MDQ was negative.  Denies mania, no psychosis, no auditory or visual hallucinations or delirium.  Denies SI or HI.  Past psychiatric medication trials: Wellbutrin Adderall Vyvanse       Visit Diagnosis:    ICD-10-CM   1. Attention deficit hyperactivity disorder (ADHD), predominantly inattentive type  F90.0 amphetamine-dextroamphetamine (ADDERALL XR) 10 MG 24 hr capsule      Past Psychiatric History: ADHD  Past Medical History:  Past Medical History:  Diagnosis Date   Asthma     Back muscle spasm    Chronic right shoulder pain    Cystic acne vulgaris    Hypertension    Umbilical hernia     Past Surgical History:  Procedure Laterality Date   ACHILLES TENDON LENGTHENING Bilateral 2009   FOOT SURGERY  2009-2010   LASIK  2016    Family Psychiatric History: see chart  Family History:  Family History  Problem Relation Age of Onset   ADD / ADHD Father    Alcohol abuse Father    Hyperlipidemia Father    Alcohol abuse Maternal Grandmother    Cancer Maternal Grandmother        breast    Social History:  Social History   Socioeconomic History   Marital status: Single    Spouse name: [name]   Number of children: 0   Years of education: 12   Highest education level: High school graduate  Occupational History   Not on file  Tobacco Use   Smoking status: Never   Smokeless tobacco: Never  Substance and Sexual Activity   Alcohol use: Yes    Alcohol/week: 0.0 standard drinks of alcohol    Comment: rarely   Drug use: No   Sexual activity: Yes  Other Topics Concern   Not on file  Social History Narrative   Single.   No children.   Works as a Therapist, sports.    Enjoys working out, Scientist, research (life sciences), spending  time with friends.   Social Drivers of Corporate investment banker Strain: Not on file  Food Insecurity: Not on file  Transportation Needs: Not on file  Physical Activity: Not on file  Stress: Not on file  Social Connections: Not on file    Allergies: No Known Allergies  Metabolic Disorder Labs: No results found for: "HGBA1C", "MPG" No results found for: "PROLACTIN" No results found for: "CHOL", "TRIG", "HDL", "CHOLHDL", "VLDL", "LDLCALC" Lab Results  Component Value Date   TSH 1.31 04/05/2016    Therapeutic Level Labs: No results found for: "LITHIUM" No results found for: "VALPROATE" No results found for: "CBMZ"  Current Medications: Current Outpatient Medications  Medication Sig Dispense Refill   amphetamine-dextroamphetamine  (ADDERALL XR) 10 MG 24 hr capsule Take 1 capsule (10 mg total) by mouth daily. 30 capsule 0   buPROPion (WELLBUTRIN XL) 150 MG 24 hr tablet TAKE 1 TABLET (150 MG TOTAL) BY MOUTH DAILY. FOR MOOD AND FOCUS. 90 tablet 1   fluticasone (FLONASE) 50 MCG/ACT nasal spray Place 2 sprays into both nostrils daily. 16 g 0   No current facility-administered medications for this visit.    Medication Side Effects: none  Orders placed this visit:  No orders of the defined types were placed in this encounter.   Psychiatric Specialty Exam:  Review of Systems  Constitutional: Negative.   Allergic/Immunologic: Negative.   Neurological: Negative.   Psychiatric/Behavioral:  Positive for decreased concentration.     There were no vitals taken for this visit.There is no height or weight on file to calculate BMI.  General Appearance: Casual, Neat, and Well Groomed  Eye Contact:  Good  Speech:  Clear and Coherent  Volume:  Normal  Mood:  NA  Affect:  Appropriate  Thought Process:  Coherent  Orientation:  Full (Time, Place, and Person)  Thought Content: Logical   Suicidal Thoughts:  No  Homicidal Thoughts:  No  Memory:  WNL  Judgement:  Good  Insight:  Good  Psychomotor Activity:  Normal  Concentration:  Concentration: Good  Recall:  Good  Fund of Knowledge: Good  Language: Good  Assets:  Desire for Improvement  ADL's:  Intact  Cognition: WNL  Prognosis:  Good   Screenings:  PHQ2-9    Flowsheet Row Office Visit from 06/15/2023 in [facility] Office Visit from 03/09/2017 in [facility] Office Visit from 02/28/2015 in [facility] Office Visit from 10/03/2014 in [facility]  PHQ-2 Total Score 0 0 0 0       Receiving Psychotherapy: No   Treatment Plan/Recommendations:   Greater than 50% of  60 min face to face time with patient was spent on counseling and coordination of care.  We discussed his long history of ADHD stemming back to third grade.  We discussed his previous plan of care along with prior psychiatric medication trials.  We agreed today to: Will start Adderall 10 mg ER daily after breakfast Will report side effects or worsening symptoms promptly To follow-up in 4 weeks to reassess Instructed patient to begin monitoring BP regularly and keep a log.  Patient to monitor weights weekly. Provided emergency contact information Discussed potential benefits, risks, and side effects of stimulants with patient to include increased heart rate, palpitations, insomnia, increased anxiety, increased irritability, or decreased appetite.   Instructed patient to contact office if experiencing any significant tolerability issues.  Reviewed PDMP     Polly Brink  Corinda Dibbles, NP

## 2023-06-16 DIAGNOSIS — F4320 Adjustment disorder, unspecified: Secondary | ICD-10-CM | POA: Diagnosis not present

## 2023-06-23 DIAGNOSIS — F4320 Adjustment disorder, unspecified: Secondary | ICD-10-CM | POA: Diagnosis not present

## 2023-06-28 DIAGNOSIS — F4320 Adjustment disorder, unspecified: Secondary | ICD-10-CM | POA: Diagnosis not present

## 2023-07-07 DIAGNOSIS — F4320 Adjustment disorder, unspecified: Secondary | ICD-10-CM | POA: Diagnosis not present

## 2023-07-13 ENCOUNTER — Encounter: Payer: Self-pay | Admitting: Behavioral Health

## 2023-07-13 ENCOUNTER — Ambulatory Visit (INDEPENDENT_AMBULATORY_CARE_PROVIDER_SITE_OTHER): Payer: Self-pay | Admitting: Behavioral Health

## 2023-07-13 DIAGNOSIS — F900 Attention-deficit hyperactivity disorder, predominantly inattentive type: Secondary | ICD-10-CM

## 2023-07-13 MED ORDER — AMPHETAMINE-DEXTROAMPHET ER 15 MG PO CP24: 15.0000 mg | ORAL_CAPSULE | ORAL | 0 refills | Status: DC

## 2023-07-13 NOTE — Progress Notes (Signed)
 [facility] Med Check  Patient ID: Charles Fischer,  MRN: [id number]  PCP: Charles John, NP  Date of Evaluation: 07/13/2023 Time spent:30 minutes  Chief Complaint:  Chief Complaint   ADHD; Follow-up; Medication Refill; Patient Education; Medication Problem     HISTORY/CURRENT STATUS: HPI [name]", 28 year old male presents to this office for follow up and medication management. Collateral information should be considered reliable.  Patient is calm and articulate.  Reports medication working a little bit but feels like he should slowly increase dose now. Reports about 20% improvement so far.  He denies any anxiety or depression.  Reports that sleep is good 7 to 8 hours per night.  Patient is working 60 hours/week.  Denies mania, no psychosis, no auditory or visual hallucinations or delirium.  Denies SI or HI.   Past psychiatric medication trials: Wellbutrin  Adderall Vyvanse  Individual Medical History/ Review of Systems: Changes? :No   Allergies: Patient has no known allergies.  Current Medications:  Current Outpatient Medications:    amphetamine -dextroamphetamine (ADDERALL XR) 15 MG 24 hr capsule, Take 1 capsule by mouth every morning., Disp: 30 capsule, Rfl: 0   amphetamine -dextroamphetamine (ADDERALL XR) 10 MG 24 hr capsule, Take 1 capsule (10 mg total) by mouth daily., Disp: 30 capsule, Rfl: 0   buPROPion  (WELLBUTRIN  XL) 150 MG 24 hr tablet, TAKE 1 TABLET (150 MG TOTAL) BY MOUTH DAILY. FOR MOOD AND FOCUS., Disp: 90 tablet, Rfl: 1   fluticasone  (FLONASE ) 50 MCG/ACT nasal spray, Place 2 sprays into both nostrils daily., Disp: 16 g, Rfl: 0 Medication Side Effects: none  Family Medical/ Social History: Changes? No  MENTAL HEALTH EXAM:  There were no vitals taken for this visit.There is no height or weight on file to calculate BMI.  General Appearance: Casual, Neat, and Well Groomed  Eye Contact:  Good  Speech:  Clear and Coherent  Volume:  Normal  Mood:  NA   Affect:  Appropriate  Thought Process:  Coherent  Orientation:  Full (Time, Place, and Person)  Thought Content: Logical   Suicidal Thoughts:  No  Homicidal Thoughts:  No  Memory:  WNL  Judgement:  Good  Insight:  Good  Psychomotor Activity:  Normal  Concentration:  Concentration: Good  Recall:  Good  Fund of Knowledge: Good  Language: Good  Assets:  Desire for Improvement  ADL's:  Intact  Cognition: WNL  Prognosis:  Good    DIAGNOSES:    ICD-10-CM   1. Attention deficit hyperactivity disorder (ADHD), predominantly inattentive type  F90.0 amphetamine -dextroamphetamine (ADDERALL XR) 15 MG 24 hr capsule      Receiving Psychotherapy: No    RECOMMENDATIONS:   Greater than 50% of  60 min face to face time with patient was spent on counseling and coordination of care. We discussed his recent improvement with attention and focus but slight. He is requesting small dosage increase this visit.   We discussed his previous plan of care along with prior psychiatric medication trials.   We agreed today to: To increase Adderall 15 mg ER daily after breakfast Will report side effects or worsening symptoms promptly To follow-up in 3 months to reassess Instructed patient to begin monitoring BP regularly and keep a log.  Patient to monitor weights weekly. Provided emergency contact information Discussed potential benefits, risks, and side effects of stimulants with patient to include increased heart rate, palpitations, insomnia, increased anxiety, increased irritability, or decreased appetite.   Instructed patient to contact office if experiencing any significant tolerability issues.  Reviewed  PDMP            Lincoln Renshaw, NP

## 2023-07-14 ENCOUNTER — Telehealth: Payer: Self-pay | Admitting: Primary Care

## 2023-07-14 NOTE — Telephone Encounter (Signed)
 Copied from CRM [id number]. Topic: Appointments - Scheduling Inquiry for Clinic >> Jul 14, 2023  3:51 PM Adrionna Y wrote: Reason for CRM: Patient is calling to see if can re establish with Tretha Fu

## 2023-07-18 NOTE — Telephone Encounter (Signed)
Okay to re-establish?

## 2023-07-19 NOTE — Telephone Encounter (Signed)
 Lvmtcb. Sent mychart message

## 2023-07-19 NOTE — Telephone Encounter (Signed)
 Spoke to pt, scheduled new pt visit for 08/10/23

## 2023-07-21 DIAGNOSIS — F4320 Adjustment disorder, unspecified: Secondary | ICD-10-CM | POA: Diagnosis not present

## 2023-08-04 DIAGNOSIS — F4320 Adjustment disorder, unspecified: Secondary | ICD-10-CM | POA: Diagnosis not present

## 2023-08-10 ENCOUNTER — Encounter: Payer: Self-pay | Admitting: Primary Care

## 2023-08-10 ENCOUNTER — Ambulatory Visit: Payer: Self-pay | Admitting: Primary Care

## 2023-08-10 VITALS — BP 144/80 | HR 82 | Temp 97.7°F | Ht 72.0 in | Wt 191.0 lb

## 2023-08-10 DIAGNOSIS — Z1329 Encounter for screening for other suspected endocrine disorder: Secondary | ICD-10-CM | POA: Diagnosis not present

## 2023-08-10 DIAGNOSIS — I10 Essential (primary) hypertension: Secondary | ICD-10-CM

## 2023-08-10 MED ORDER — VALSARTAN 80 MG PO TABS: 80.0000 mg | ORAL_TABLET | Freq: Every day | ORAL | 0 refills | Status: DC

## 2023-08-10 NOTE — Patient Instructions (Addendum)
Start valsartan 80 mg once daily for blood pressure.  Stop by the lab prior to leaving today. I will notify you of your results once received.   Please schedule a follow up visit to meet back with me in 2-3 weeks for blood pressure check.   It was a pleasure to see you today!

## 2023-08-10 NOTE — Assessment & Plan Note (Signed)
 Above goal today, also with home readings.   We discussed that Adderall can raise BP. Testosterone  could be contributing to hypertension but he doesn't plan on stopping.  Start valsartan 80 mg daily.  We will see him back in 2-3 weeks for BP check and BMP

## 2023-08-10 NOTE — Progress Notes (Signed)
 Subjective:    Patient ID: QUAVIS KLUTZ, male    DOB: 04-08-1995, 28 y.o.   MRN: [id number]  HPI  Charles Fischer is a very pleasant 28 y.o. male with a history of ADHD, insomnia, asthma, testosterone  deficiency who presents today to re-establish care and to discuss high blood pressure.  1) Asthma: Currently not managed on treatment. Denies wheezing, shortness of breath.   2) Elevated BP Readings: Two months ago he was told that he had an elevated BP readings by his psychiatrist. He's been checking his BP once daily over the last 1 month at home with rest and is getting readings of 140-150/70-90. He does notice headaches, mostly if he doesn't get much sleep. He denies dizziness. He does notice skipped heart beats on occasion.  He does have a prior history of high blood pressure, was managed on lisinopril  and hydrochlorothiazide . BP normalized after he quit a stressful job.  He began Adderall 2 months ago, BP was above goal prior to initiation of Adderall. He has been on testosterone  consistently since 2022, has had BP checked at Urology office doesn't recall readings.   BP Readings from Last 3 Encounters:  08/10/23 (!) 144/80  06/15/23 (!) 143/96  02/15/19 120/84   3) ADHD: Currently following with psychiatry. Managed on Adderal XR 15 mg daily which was increased from 10 mg in May 2025. Feels well managed on this dose.   4) Testosterone  Deficiency: Currently managed on Testosterone  0.4 ml every three days. Has been using consistently April 2022. Follows with Urology in [place].   Review of Systems  Respiratory:  Negative for shortness of breath.   Cardiovascular:  Positive for palpitations.  Gastrointestinal:  Negative for constipation and diarrhea.  Neurological:  Positive for headaches. Negative for dizziness.         Past Medical History:  Diagnosis Date   Asthma    Back muscle spasm    Chronic right shoulder pain    Cystic acne vulgaris    Hypertension     Umbilical hernia     Social History   Socioeconomic History   Marital status: Single    Spouse name: [name]   Number of children: 0   Years of education: 12   Highest education level: High school graduate  Occupational History   Not on file  Tobacco Use   Smoking status: Never   Smokeless tobacco: Never  Substance and Sexual Activity   Alcohol use: Yes    Alcohol/week: 0.0 standard drinks of alcohol    Comment: rarely   Drug use: No   Sexual activity: Yes  Other Topics Concern   Not on file  Social History Narrative   Single.   No children.   Works as a Therapist, sports.    Enjoys working out, playing games, spending time with friends.   Social Drivers of Corporate investment banker Strain: Not on file  Food Insecurity: Not on file  Transportation Needs: Not on file  Physical Activity: Not on file  Stress: Not on file  Social Connections: Not on file  Intimate Partner Violence: Not on file    Past Surgical History:  Procedure Laterality Date   ACHILLES TENDON LENGTHENING Bilateral 2009   FOOT SURGERY  2009-2010   LASIK  2016    Family History  Problem Relation Age of Onset   ADD / ADHD Father    Alcohol abuse Father    Hyperlipidemia Father    Alcohol abuse Maternal Grandmother  Cancer Maternal Grandmother        breast    No Known Allergies  Current Outpatient Medications on File Prior to Visit  Medication Sig Dispense Refill   amphetamine -dextroamphetamine (ADDERALL XR) 15 MG 24 hr capsule Take 1 capsule by mouth every morning. 30 capsule 0   No current facility-administered medications on file prior to visit.    BP (!) 144/80   Pulse 82   Temp 97.7 F (36.5 C) (Temporal)   Ht 6' (1.829 m)   Wt 191 lb (86.6 kg)   SpO2 100%   BMI 25.90 kg/m  Objective:   Physical Exam Cardiovascular:     Rate and Rhythm: Normal rate and regular rhythm.  Pulmonary:     Effort: Pulmonary effort is normal.     Breath sounds: Normal breath sounds.  Abdominal:      General: Bowel sounds are normal.     Palpations: Abdomen is soft.     Tenderness: There is no abdominal tenderness.  Musculoskeletal:     Cervical back: Neck supple.  Skin:    General: Skin is warm and dry.  Neurological:     Mental Status: He is alert and oriented to person, place, and time.  Psychiatric:        Mood and Affect: Mood normal.           Assessment & Plan:  Primary hypertension Assessment & Plan: Above goal today, also with home readings.   We discussed that Adderall can raise BP. Testosterone  could be contributing to hypertension but he doesn't plan on stopping.  Start valsartan 80 mg daily.  We will see him back in 2-3 weeks for BP check and BMP  Orders: -     TSH -     Comprehensive metabolic panel with GFR -     CBC -     Hemoglobin A1c -     Valsartan; Take 1 tablet (80 mg total) by mouth daily. for blood pressure.  Dispense: 30 tablet; Refill: 0        Gabriel John, NP

## 2023-08-11 ENCOUNTER — Ambulatory Visit: Payer: Self-pay | Admitting: Primary Care

## 2023-08-11 LAB — CBC
HCT: 48.8 % (ref 38.5–50.0)
Hemoglobin: 16.4 g/dL (ref 13.2–17.1)
MCH: 30.8 pg (ref 27.0–33.0)
MCHC: 33.6 g/dL (ref 32.0–36.0)
MCV: 91.7 fL (ref 80.0–100.0)
MPV: 9.9 fL (ref 7.5–12.5)
Platelets: 266 10*3/uL (ref 140–400)
RBC: 5.32 10*6/uL (ref 4.20–5.80)
RDW: 11.8 % (ref 11.0–15.0)
WBC: 7.1 10*3/uL (ref 3.8–10.8)

## 2023-08-11 LAB — TSH: TSH: 1.77 m[IU]/L (ref 0.40–4.50)

## 2023-08-11 LAB — HEMOGLOBIN A1C
Hgb A1c MFr Bld: 5.3 % (ref <5.7)
Mean Plasma Glucose: 105 mg/dL
eAG (mmol/L): 5.8 mmol/L

## 2023-08-11 LAB — COMPREHENSIVE METABOLIC PANEL WITH GFR
AG Ratio: 1.8 (calc) (ref 1.0–2.5)
ALT: 23 U/L (ref 9–46)
AST: 19 U/L (ref 10–40)
Albumin: 4.8 g/dL (ref 3.6–5.1)
Alkaline phosphatase (APISO): 46 U/L (ref 36–130)
BUN: 11 mg/dL (ref 7–25)
CO2: 28 mmol/L (ref 20–32)
Calcium: 9.8 mg/dL (ref 8.6–10.3)
Chloride: 99 mmol/L (ref 98–110)
Creat: 0.97 mg/dL (ref 0.60–1.24)
Globulin: 2.6 g/dL (ref 1.9–3.7)
Glucose, Bld: 82 mg/dL (ref 65–99)
Potassium: 3.9 mmol/L (ref 3.5–5.3)
Sodium: 137 mmol/L (ref 135–146)
Total Bilirubin: 0.6 mg/dL (ref 0.2–1.2)
Total Protein: 7.4 g/dL (ref 6.1–8.1)
eGFR: 108 mL/min/{1.73_m2} (ref >=60)

## 2023-08-18 DIAGNOSIS — F4320 Adjustment disorder, unspecified: Secondary | ICD-10-CM | POA: Diagnosis not present

## 2023-08-23 ENCOUNTER — Other Ambulatory Visit: Payer: Self-pay | Admitting: Primary Care

## 2023-08-23 DIAGNOSIS — I10 Essential (primary) hypertension: Secondary | ICD-10-CM

## 2023-08-24 ENCOUNTER — Other Ambulatory Visit: Payer: Self-pay

## 2023-08-24 ENCOUNTER — Telehealth: Payer: Self-pay | Admitting: Behavioral Health

## 2023-08-24 MED ORDER — AMPHETAMINE-DEXTROAMPHET ER 20 MG PO CP24: 20.0000 mg | ORAL_CAPSULE | Freq: Every day | ORAL | 0 refills | Status: DC

## 2023-08-24 NOTE — Telephone Encounter (Signed)
 [facility]

## 2023-08-24 NOTE — Telephone Encounter (Signed)
 Pt called in stating he needs a refill on Adderall and he req it to be increased to 20mg .  [id number] [place] - [address]    Next appt 8/15

## 2023-08-25 ENCOUNTER — Ambulatory Visit: Admitting: Primary Care

## 2023-08-25 ENCOUNTER — Encounter: Payer: Self-pay | Admitting: Primary Care

## 2023-08-25 VITALS — BP 144/80 | HR 88 | Temp 97.2°F | Ht 72.0 in | Wt 187.0 lb

## 2023-08-25 DIAGNOSIS — I10 Essential (primary) hypertension: Secondary | ICD-10-CM | POA: Diagnosis not present

## 2023-08-25 DIAGNOSIS — R251 Tremor, unspecified: Secondary | ICD-10-CM | POA: Insufficient documentation

## 2023-08-25 NOTE — Telephone Encounter (Signed)
 Prior authorization approved through 08/24/26 for Amphetamine -dextroamphetamine ER 20 mg capsules with [employer]

## 2023-08-25 NOTE — Assessment & Plan Note (Signed)
 Above goal today, home readings are at goal.  Continue valsartan  80 mg daily. BMP pending.  He will continue to monitor BP at home.

## 2023-08-25 NOTE — Assessment & Plan Note (Signed)
 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  Lower likelihood of other neurological disease but will consider further workup if conservative treatment is ineffective. Reviewed labs from prior visit 2 weeks ago.  Discussed options for treatment which includes physical/Occupational Therapy versus propranolol/primidone. Will start with physical therapy.  Consider neurology referral in the future if warranted.

## 2023-08-25 NOTE — Progress Notes (Signed)
 Subjective:    Patient ID: Charles Fischer, male    DOB: 1995/11/02, 28 y.o.   MRN: [id number]  HPI  Charles Fischer is a very pleasant 28 y.o. male with a history of hypertension, asthma, testosterone  deficiency, ADHD who presents today for follow-up of hypertension.  He would also like to discuss tremor.  1) Hypertension: He was last evaluated on 08/10/2023 to reestablish care.  During this visit his blood pressure was above goal.  He had also experienced elevated readings at home and with different doctors appointments.  He did have a problem with high blood pressure readings several years ago, was able to come off treatment.  During this visit we initiated valsartan  80 mg daily.  He is here for follow-up today.  Since his last visit he is compliant to valsartan  80 mg daily. He is checking his BP at home which is 120/60s. He forgot to take his Valsartan  today. He denies dizziness, headaches.   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 The tremor only occurs when he is flexing his biceps muscle, lifts heavy objects, makes a tight fist. He does notice this to his left upper extremity but his right extremity is worse.   He denies numbness/tingling, weakness, family history of neurological disorders, tremors with eating or writing, tremors with rest.  He resumed his Adderall 2 months ago.  No other new medications or supplements.  No excessive caffeine use.    Review of Systems  Respiratory:  Negative for shortness of breath.   Cardiovascular:  Negative for chest pain.  Neurological:  Positive for tremors. Negative for dizziness, weakness and headaches.         Past Medical History:  Diagnosis Date   Asthma    Back muscle spasm    Chronic right shoulder pain    Cystic acne vulgaris    Hypertension    Umbilical  hernia     Social History   Socioeconomic History   Marital status: Single    Spouse name: [name]   Number of children: 0   Years of education: 12   Highest education level: High school graduate  Occupational History   Not on file  Tobacco Use   Smoking status: Never   Smokeless tobacco: Never  Substance and Sexual Activity   Alcohol use: Yes    Alcohol/week: 0.0 standard drinks of alcohol    Comment: rarely   Drug use: No   Sexual activity: Yes  Other Topics Concern   Not on file  Social History Narrative   Single.   No children.   Works as a Therapist, sports.    Enjoys working out, playing games, spending time with friends.   Social Drivers of Corporate investment banker Strain: Not on file  Food Insecurity: Not on file  Transportation Needs: Not on file  Physical Activity: Not on file  Stress: Not on file  Social Connections: Not on file  Intimate Partner Violence: Not on file    Past Surgical History:  Procedure Laterality Date   ACHILLES TENDON LENGTHENING Bilateral 2009   FOOT SURGERY  2009-2010   LASIK  2016    Family History  Problem Relation Age of Onset   ADD / ADHD Father    Alcohol abuse Father    Hyperlipidemia Father    Alcohol  abuse Maternal Grandmother    Cancer Maternal Grandmother        breast    No Known Allergies  Current Outpatient Medications on File Prior to Visit  Medication Sig Dispense Refill   amphetamine -dextroamphetamine (ADDERALL XR) 20 MG 24 hr capsule Take 1 capsule (20 mg total) by mouth daily. 30 capsule 0   valsartan  (DIOVAN ) 80 MG tablet Take 1 tablet (80 mg total) by mouth daily. for blood pressure. 30 tablet 0   No current facility-administered medications on file prior to visit.    BP (!) 144/80   Pulse 88   Temp (!) 97.2 F (36.2 C) (Temporal)   Ht 6' (1.829 m)   Wt 187 lb (84.8 kg)   SpO2 99%   BMI 25.36 kg/m  Objective:   Physical Exam  Cardiovascular:     Rate and Rhythm: Normal rate and regular  rhythm.  Pulmonary:     Effort: Pulmonary effort is normal.     Breath sounds: Normal breath sounds.   Musculoskeletal:     Cervical back: Neck supple.     Comments: 5/5 strength to bilateral upper extremities   Skin:    General: Skin is warm and dry.   Neurological:     Mental Status: He is alert and oriented to person, place, and time.     Motor: Motor function is intact.     Coordination: Coordination is intact.     Comments: Faint resting tremor to bilateral hands.  Moderate tremor to right hand with muscle flexion and when making fist.  Mild flexion to left hand flexion, making fist.  Psychiatric:        Mood and Affect: Mood normal.           Assessment & Plan:  Primary hypertension Assessment & Plan: Above goal today, home readings are at goal.  Continue valsartan  80 mg daily. BMP pending.  He will continue to monitor BP at home.  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  Lower likelihood of other neurological disease but will consider further workup if conservative treatment is ineffective. Reviewed labs from prior visit 2 weeks ago.  Discussed options for treatment which includes physical/Occupational Therapy versus propranolol/primidone. Will start with physical therapy.  Consider neurology referral in the future if warranted.  Orders: -     Ambulatory referral to Physical Therapy        Comer MARLA Gaskins, NP

## 2023-08-26 ENCOUNTER — Ambulatory Visit: Payer: Self-pay | Admitting: Primary Care

## 2023-08-26 LAB — BASIC METABOLIC PANEL WITH GFR
BUN: 11 mg/dL (ref 6–23)
CO2: 28 meq/L (ref 19–32)
Calcium: 9.5 mg/dL (ref 8.4–10.5)
Chloride: 101 meq/L (ref 96–112)
Creatinine, Ser: 1.03 mg/dL (ref 0.40–1.50)
GFR: 98.01 mL/min (ref >60.00)
Glucose, Bld: 102 mg/dL — ABNORMAL HIGH (ref 70–99)
Potassium: 4.1 meq/L (ref 3.5–5.1)
Sodium: 138 meq/L (ref 135–145)

## 2023-09-01 DIAGNOSIS — F4320 Adjustment disorder, unspecified: Secondary | ICD-10-CM | POA: Diagnosis not present

## 2023-09-08 DIAGNOSIS — F4320 Adjustment disorder, unspecified: Secondary | ICD-10-CM | POA: Diagnosis not present

## 2023-09-15 DIAGNOSIS — F4320 Adjustment disorder, unspecified: Secondary | ICD-10-CM | POA: Diagnosis not present

## 2023-09-19 ENCOUNTER — Telehealth: Payer: Self-pay | Admitting: Behavioral Health

## 2023-09-19 NOTE — Telephone Encounter (Signed)
 Patient called in for refill on Adderall 20mg . Ph: [phone] Appt 8/15 [facility]acy [facility] [address] [employer]

## 2023-09-19 NOTE — Telephone Encounter (Signed)
LF 6/25, due 7/23

## 2023-09-21 ENCOUNTER — Other Ambulatory Visit: Payer: Self-pay

## 2023-09-21 MED ORDER — AMPHETAMINE-DEXTROAMPHET ER 20 MG PO CP24: 20.0000 mg | ORAL_CAPSULE | Freq: Every day | ORAL | 0 refills | Status: DC

## 2023-09-21 NOTE — Telephone Encounter (Signed)
 Pended.

## 2023-09-29 DIAGNOSIS — F4320 Adjustment disorder, unspecified: Secondary | ICD-10-CM | POA: Diagnosis not present

## 2023-10-04 DIAGNOSIS — F4320 Adjustment disorder, unspecified: Secondary | ICD-10-CM | POA: Diagnosis not present

## 2023-10-14 ENCOUNTER — Telehealth (INDEPENDENT_AMBULATORY_CARE_PROVIDER_SITE_OTHER): Admitting: Behavioral Health

## 2023-10-14 ENCOUNTER — Encounter: Payer: Self-pay | Admitting: Behavioral Health

## 2023-10-14 DIAGNOSIS — F900 Attention-deficit hyperactivity disorder, predominantly inattentive type: Secondary | ICD-10-CM | POA: Diagnosis not present

## 2023-10-14 MED ORDER — AMPHETAMINE-DEXTROAMPHET ER 20 MG PO CP24: 20.0000 mg | ORAL_CAPSULE | Freq: Every day | ORAL | 0 refills | Status: DC

## 2023-10-14 NOTE — Progress Notes (Signed)
 Charles Fischer [id number] 1995/11/12 28 y.o.  Virtual Visit via Video Note  I connected with pt @ on 10/14/23 at  2:00 PM EDT by a video enabled telemedicine application and verified that I am speaking with the correct person using two identifiers.   I discussed the limitations of evaluation and management by telemedicine and the availability of in person appointments. The patient expressed understanding and agreed to proceed.  I discussed the assessment and treatment plan with the patient. The patient was provided an opportunity to ask questions and all were answered. The patient agreed with the plan and demonstrated an understanding of the instructions.   The patient was advised to call back or seek an in-person evaluation if the symptoms worsen or if the condition fails to improve as anticipated.  I provided 30 minutes of non-face-to-face time during this encounter.  The patient was located at home.  The provider was located at [facility].   Redell DELENA Pizza, NP   Subjective:   Patient ID:  Charles Fischer is a 28 y.o. (DOB [birth year]) male.  Chief Complaint:  Chief Complaint  Patient presents with   ADHD   Follow-up   Patient Education   Medication Refill    HPI Cadin, 28 year old male presents to this office for follow up and medication management. Collateral information should be considered reliable.  Patient is calm and articulate. Says that he feels we have reached a sweet spot with Adderall XR and does not need medication adjustment this visit.   Reports that sleep is good 7 to 8 hours per night.  Patient is working 60 hours/week.  Recently got promotion at works. Very happy with his medication so far. Denies mania, no psychosis, no auditory or visual hallucinations or delirium.  Denies SI or HI.   Past psychiatric medication trials: Wellbutrin  Adderall Vyvanse   Review of Systems:  Review of Systems  Constitutional: Negative.    Allergic/Immunologic: Negative.   Neurological: Negative.   Psychiatric/Behavioral: Negative.      Medications: I have reviewed the patient's current medications.  Current Outpatient Medications  Medication Sig Dispense Refill   [START ON 10/27/2023] amphetamine -dextroamphetamine (ADDERALL XR) 20 MG 24 hr capsule Take 1 capsule (20 mg total) by mouth daily. 30 capsule 0   valsartan  (DIOVAN ) 80 MG tablet TAKE 1 TABLET BY MOUTH EVERY DAY FOR BLOOD PRESSURE 90 tablet 3   No current facility-administered medications for this visit.    Medication Side Effects: None  Allergies: No Known Allergies  Past Medical History:  Diagnosis Date   Asthma    Back muscle spasm    Chronic right shoulder pain    Cystic acne vulgaris    Hypertension    Umbilical hernia     Family History  Problem Relation Age of Onset   ADD / ADHD Father    Alcohol abuse Father    Hyperlipidemia Father    Alcohol abuse Maternal Grandmother    Cancer Maternal Grandmother        breast    Social History   Socioeconomic History   Marital status: Single    Spouse name: [name]   Number of children: 0   Years of education: 12   Highest education level: High school graduate  Occupational History   Not on file  Tobacco Use   Smoking status: Never   Smokeless tobacco: Never  Substance and Sexual Activity   Alcohol use: Yes    Alcohol/week: 0.0 standard drinks of alcohol  Comment: rarely   Drug use: No   Sexual activity: Yes  Other Topics Concern   Not on file  Social History Narrative   Single.   No children.   Works as a Therapist, sports.    Enjoys working out, playing games, spending time with friends.   Social Drivers of Corporate investment banker Strain: Not on file  Food Insecurity: Not on file  Transportation Needs: Not on file  Physical Activity: Not on file  Stress: Not on file  Social Connections: Not on file  Intimate Partner Violence: Not on file    Past Medical History, Surgical  history, Social history, and Family history were reviewed and updated as appropriate.   Please see review of systems for further details on the patient's review from today.   Objective:   Physical Exam:  There were no vitals taken for this visit.  Physical Exam Constitutional:      General: He is not in acute distress.    Appearance: Normal appearance.  Neurological:     Mental Status: He is alert and oriented to person, place, and time.     Gait: Gait normal.  Psychiatric:        Attention and Perception: Attention and perception normal. He does not perceive auditory or visual hallucinations.        Mood and Affect: Mood and affect normal. Mood is not anxious or depressed. Affect is not labile.        Speech: Speech normal.        Behavior: Behavior normal. Behavior is cooperative.        Thought Content: Thought content normal.        Cognition and Memory: Cognition and memory normal.        Judgment: Judgment normal.     Lab Review:     Component Value Date/Time   NA 138 08/25/2023 1525   K 4.1 08/25/2023 1525   CL 101 08/25/2023 1525   CO2 28 08/25/2023 1525   GLUCOSE 102 (H) 08/25/2023 1525   BUN 11 08/25/2023 1525   CREATININE 1.03 08/25/2023 1525   CREATININE 0.97 08/10/2023 1545   CALCIUM 9.5 08/25/2023 1525   PROT 7.4 08/10/2023 1545   ALBUMIN 4.6 04/05/2016 0817   AST 19 08/10/2023 1545   ALT 23 08/10/2023 1545   ALKPHOS 75 04/05/2016 0817   BILITOT 0.6 08/10/2023 1545       Component Value Date/Time   WBC 7.1 08/10/2023 1545   RBC 5.32 08/10/2023 1545   HGB 16.4 08/10/2023 1545   HCT 48.8 08/10/2023 1545   PLT 266 08/10/2023 1545   MCV 91.7 08/10/2023 1545   MCH 30.8 08/10/2023 1545   MCHC 33.6 08/10/2023 1545   RDW 11.8 08/10/2023 1545    No results found for: POCLITH, LITHIUM   No results found for: PHENYTOIN, PHENOBARB, VALPROATE, CBMZ   .res Assessment: Plan:    Greater than 50% of  60 min face to face time with patient was  spent on counseling and coordination of care. We discussed his recent improvement with attention and focus but slight. He is requesting small dosage increase this visit.   We discussed his previous plan of care along with prior psychiatric medication trials.   We agreed today to:  To continue Adderall 20  mg ER daily after breakfast Will report side effects or worsening symptoms promptly To follow-up in 3 months to reassess Instructed patient to begin monitoring BP regularly and keep a log.  Patient to monitor weights weekly. Provided emergency contact information Discussed potential benefits, risks, and side effects of stimulants with patient to include increased heart rate, palpitations, insomnia, increased anxiety, increased irritability, or decreased appetite.   Instructed patient to contact office if experiencing any significant tolerability issues.  Reviewed PDMP    [facility] LABOR. Brittanny Levenhagen, NP    Dale was seen today for adhd, follow-up, patient education and medication refill.  Diagnoses and all orders for this visit:  Attention deficit hyperactivity disorder (ADHD), predominantly inattentive type -     amphetamine -dextroamphetamine (ADDERALL XR) 20 MG 24 hr capsule; Take 1 capsule (20 mg total) by mouth daily.     Please see After Visit Summary for patient specific instructions.  No future appointments.  No orders of the defined types were placed in this encounter.     -------------------------------

## 2023-10-20 DIAGNOSIS — F4320 Adjustment disorder, unspecified: Secondary | ICD-10-CM | POA: Diagnosis not present

## 2023-10-27 DIAGNOSIS — F4320 Adjustment disorder, unspecified: Secondary | ICD-10-CM | POA: Diagnosis not present

## 2023-11-17 DIAGNOSIS — F4320 Adjustment disorder, unspecified: Secondary | ICD-10-CM | POA: Diagnosis not present

## 2023-11-28 ENCOUNTER — Telehealth: Payer: Self-pay | Admitting: Behavioral Health

## 2023-11-28 NOTE — Telephone Encounter (Signed)
 Appt 11/17    Needs rf of adderall   [facility] [address]  [place]

## 2023-11-28 NOTE — Telephone Encounter (Signed)
LF 9/4, due 10/2

## 2023-12-01 ENCOUNTER — Other Ambulatory Visit: Payer: Self-pay

## 2023-12-01 DIAGNOSIS — F900 Attention-deficit hyperactivity disorder, predominantly inattentive type: Secondary | ICD-10-CM

## 2023-12-01 MED ORDER — AMPHETAMINE-DEXTROAMPHET ER 20 MG PO CP24: 20.0000 mg | ORAL_CAPSULE | Freq: Every day | ORAL | 0 refills | Status: DC

## 2023-12-01 NOTE — Telephone Encounter (Signed)
 Pended

## 2023-12-28 DIAGNOSIS — F4320 Adjustment disorder, unspecified: Secondary | ICD-10-CM | POA: Diagnosis not present

## 2024-01-12 DIAGNOSIS — F4320 Adjustment disorder, unspecified: Secondary | ICD-10-CM | POA: Diagnosis not present

## 2024-01-16 ENCOUNTER — Encounter: Payer: Self-pay | Admitting: Behavioral Health

## 2024-01-16 ENCOUNTER — Telehealth: Admitting: Behavioral Health

## 2024-01-16 DIAGNOSIS — F900 Attention-deficit hyperactivity disorder, predominantly inattentive type: Secondary | ICD-10-CM

## 2024-01-16 DIAGNOSIS — E349 Endocrine disorder, unspecified: Secondary | ICD-10-CM

## 2024-01-16 NOTE — Progress Notes (Signed)
 DOLPH TAVANO [id number] 1995/12/19 28 y.o.  Virtual Visit via Video Note  I connected with pt @ on 01/16/24 at  3:30 PM EST by a video enabled telemedicine application and verified that I am speaking with the correct person using two identifiers.   I discussed the limitations of evaluation and management by telemedicine and the availability of in person appointments. The patient expressed understanding and agreed to proceed.  I discussed the assessment and treatment plan with the patient. The patient was provided an opportunity to ask questions and all were answered. The patient agreed with the plan and demonstrated an understanding of the instructions.   The patient was advised to call back or seek an in-person evaluation if the symptoms worsen or if the condition fails to improve as anticipated.  I provided 20 minutes of non-face-to-face time during this encounter.  The patient was located at home.  The provider was located at [facility].   Redell DELENA Pizza, NP   Subjective:   Patient ID:  KALYAN BARABAS is a 28 y.o. (DOB 1995/06/21) male.  Chief Complaint: No chief complaint on file.   HPI  Miachel, 28 year old male presents to this office for follow up and medication management. Collateral information should be considered reliable.  Patient is calm and articulate.  Smiling and happy today very happy with his medication.  No adjustments are indicated at this visit.   Reports that sleep is good 7 to 8 hours per night.  Patient is working 60 hours/week.  Recently got promotion at works. Very happy with his medication so far. Denies mania, no psychosis, no auditory or visual hallucinations or delirium.  Denies SI or HI.   Past psychiatric medication trials: Wellbutrin  Adderall Vyvanse    Review of Systems:  Review of Systems  Constitutional: Negative.   Allergic/Immunologic: Negative.   Neurological: Negative.   Psychiatric/Behavioral: Negative.       Medications: I have reviewed the patient's current medications.  Current Outpatient Medications  Medication Sig Dispense Refill   amphetamine -dextroamphetamine (ADDERALL XR) 20 MG 24 hr capsule Take 1 capsule (20 mg total) by mouth daily. 30 capsule 0   amphetamine -dextroamphetamine (ADDERALL XR) 20 MG 24 hr capsule Take 1 capsule (20 mg total) by mouth daily. 30 capsule 0   valsartan  (DIOVAN ) 80 MG tablet TAKE 1 TABLET BY MOUTH EVERY DAY FOR BLOOD PRESSURE 90 tablet 3   No current facility-administered medications for this visit.    Medication Side Effects: None  Allergies: No Known Allergies  Past Medical History:  Diagnosis Date   Asthma    Back muscle spasm    Chronic right shoulder pain    Cystic acne vulgaris    Hypertension    Umbilical hernia     Family History  Problem Relation Age of Onset   ADD / ADHD Father    Alcohol abuse Father    Hyperlipidemia Father    Alcohol abuse Maternal Grandmother    Cancer Maternal Grandmother        breast    Social History   Socioeconomic History   Marital status: Single    Spouse name: [name]   Number of children: 0   Years of education: 12   Highest education level: High school graduate  Occupational History   Not on file  Tobacco Use   Smoking status: Never   Smokeless tobacco: Never  Substance and Sexual Activity   Alcohol use: Yes    Alcohol/week: 0.0 standard drinks of alcohol  Comment: rarely   Drug use: No   Sexual activity: Yes  Other Topics Concern   Not on file  Social History Narrative   Single.   No children.   Works as a therapist, sports.    Enjoys working out, playing games, spending time with friends.   Social Drivers of Corporate Investment Banker Strain: Not on file  Food Insecurity: Not on file  Transportation Needs: Not on file  Physical Activity: Not on file  Stress: Not on file  Social Connections: Not on file  Intimate Partner Violence: Not on file    Past Medical History,  Surgical history, Social history, and Family history were reviewed and updated as appropriate.   Please see review of systems for further details on the patient's review from today.   Objective:   Physical Exam:  There were no vitals taken for this visit.  Physical Exam  Lab Review:     Component Value Date/Time   NA 138 08/25/2023 1525   K 4.1 08/25/2023 1525   CL 101 08/25/2023 1525   CO2 28 08/25/2023 1525   GLUCOSE 102 (H) 08/25/2023 1525   BUN 11 08/25/2023 1525   CREATININE 1.03 08/25/2023 1525   CREATININE 0.97 08/10/2023 1545   CALCIUM 9.5 08/25/2023 1525   PROT 7.4 08/10/2023 1545   ALBUMIN 4.6 04/05/2016 0817   AST 19 08/10/2023 1545   ALT 23 08/10/2023 1545   ALKPHOS 75 04/05/2016 0817   BILITOT 0.6 08/10/2023 1545       Component Value Date/Time   WBC 7.1 08/10/2023 1545   RBC 5.32 08/10/2023 1545   HGB 16.4 08/10/2023 1545   HCT 48.8 08/10/2023 1545   PLT 266 08/10/2023 1545   MCV 91.7 08/10/2023 1545   MCH 30.8 08/10/2023 1545   MCHC 33.6 08/10/2023 1545   RDW 11.8 08/10/2023 1545    No results found for: POCLITH, LITHIUM   No results found for: PHENYTOIN, PHENOBARB, VALPROATE, CBMZ   .res Assessment: Plan:    Greater than 50% of  20  min face to face time with patient was spent on counseling and coordination of care. We discussed his recent improvement with attention and focus but slight.  Very happy with medication and requesting no medication changes this visit  We agreed today to:   To continue Adderall 20  mg ER daily after breakfast Will report side effects or worsening symptoms promptly To follow-up in 3 months to reassess Instructed patient to begin monitoring BP regularly and keep a log.  Patient to monitor weights weekly. Provided emergency contact information Discussed potential benefits, risks, and side effects of stimulants with patient to include increased heart rate, palpitations, insomnia, increased anxiety,  increased irritability, or decreased appetite.   Instructed patient to contact office if experiencing any significant tolerability issues.  Reviewed PDMP    [facility] LABOR. Genelda Roark, NP      Diagnoses and all orders for this visit:  Attention deficit hyperactivity disorder (ADHD), predominantly inattentive type     Please see After Visit Summary for patient specific instructions.  No future appointments.  No orders of the defined types were placed in this encounter.     -------------------------------

## 2024-01-27 ENCOUNTER — Other Ambulatory Visit: Payer: Self-pay | Admitting: Primary Care

## 2024-01-27 DIAGNOSIS — E349 Endocrine disorder, unspecified: Secondary | ICD-10-CM

## 2024-02-03 DIAGNOSIS — F4320 Adjustment disorder, unspecified: Secondary | ICD-10-CM | POA: Diagnosis not present

## 2024-02-07 ENCOUNTER — Telehealth: Payer: Self-pay | Admitting: Behavioral Health

## 2024-02-07 NOTE — Telephone Encounter (Signed)
 Need rf on Adderall   [facility] [address]

## 2024-02-08 NOTE — Telephone Encounter (Signed)
 LF 11/16, due 12/14

## 2024-02-10 ENCOUNTER — Other Ambulatory Visit: Payer: Self-pay

## 2024-02-10 DIAGNOSIS — F900 Attention-deficit hyperactivity disorder, predominantly inattentive type: Secondary | ICD-10-CM

## 2024-02-10 MED ORDER — AMPHETAMINE-DEXTROAMPHET ER 20 MG PO CP24: 20.0000 mg | ORAL_CAPSULE | Freq: Every day | ORAL | 0 refills | Status: AC

## 2024-02-10 NOTE — Telephone Encounter (Signed)
Pended 3 RF.

## 2024-02-16 DIAGNOSIS — F4320 Adjustment disorder, unspecified: Secondary | ICD-10-CM | POA: Diagnosis not present

## 2024-04-20 ENCOUNTER — Telehealth: Admitting: Behavioral Health
